# Patient Record
Sex: Male | Born: 1995 | Race: White | Hispanic: No | Marital: Single | Smoking: Never smoker
Health system: Southern US, Community
[De-identification: ages and names within clinical notes are randomized; demographics above are authoritative.]

## PROBLEM LIST (undated history)

## (undated) DIAGNOSIS — Z9109 Other allergy status, other than to drugs and biological substances: Secondary | ICD-10-CM

## (undated) DIAGNOSIS — R569 Unspecified convulsions: Secondary | ICD-10-CM

## (undated) HISTORY — PX: MYRINGOTOMY WITH TUBE PLACEMENT: SHX5663

## (undated) HISTORY — DX: Other allergy status, other than to drugs and biological substances: Z91.09

## (undated) HISTORY — DX: Unspecified convulsions: R56.9

---

## 2014-05-21 ENCOUNTER — Emergency Department (HOSPITAL_COMMUNITY)
Admission: EM | Admit: 2014-05-21 | Discharge: 2014-05-22 | Disposition: A | Discharge status: Home / Self Care | Payer: BC Managed Care – PPO | Attending: Emergency Medicine | Admitting: Emergency Medicine

## 2014-05-21 ENCOUNTER — Encounter (HOSPITAL_COMMUNITY): Payer: Self-pay | Admitting: *Deleted

## 2014-05-21 DIAGNOSIS — Z79899 Other long term (current) drug therapy: Secondary | ICD-10-CM | POA: Diagnosis not present

## 2014-05-21 DIAGNOSIS — R410 Disorientation, unspecified: Secondary | ICD-10-CM | POA: Diagnosis not present

## 2014-05-21 DIAGNOSIS — R110 Nausea: Secondary | ICD-10-CM | POA: Diagnosis not present

## 2014-05-21 DIAGNOSIS — R569 Unspecified convulsions: Secondary | ICD-10-CM

## 2014-05-21 LAB — CBG MONITORING, ED: Glucose-Capillary: 83 mg/dL (ref 70–99)
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
## 2014-05-22 ENCOUNTER — Emergency Department (HOSPITAL_COMMUNITY): Payer: BC Managed Care – PPO

## 2014-05-22 LAB — CBC WITH DIFFERENTIAL/PLATELET
BASOS ABS: 0 10*3/uL (ref 0.0–0.1)
Basophils Relative: 0 % (ref 0–1)
Eosinophils Absolute: 0.2 10*3/uL (ref 0.0–0.7)
Eosinophils Relative: 1 % (ref 0–5)
HEMATOCRIT: 39.8 % (ref 39.0–52.0)
Hemoglobin: 14.1 g/dL (ref 13.0–17.0)
LYMPHS PCT: 25 % (ref 12–46)
Lymphs Abs: 3 10*3/uL (ref 0.7–4.0)
MCH: 29.5 pg (ref 26.0–34.0)
MCHC: 35.4 g/dL (ref 30.0–36.0)
MCV: 83.3 fL (ref 78.0–100.0)
Monocytes Absolute: 0.5 10*3/uL (ref 0.1–1.0)
Monocytes Relative: 4 % (ref 3–12)
NEUTROS ABS: 8.2 10*3/uL — AB (ref 1.7–7.7)
NEUTROS PCT: 70 % (ref 43–77)
Platelets: 300 10*3/uL (ref 150–400)
RBC: 4.78 MIL/uL (ref 4.22–5.81)
RDW: 12.4 % (ref 11.5–15.5)
WBC: 11.9 10*3/uL — AB (ref 4.0–10.5)

## 2014-05-22 LAB — BASIC METABOLIC PANEL
ANION GAP: 13 (ref 5–15)
BUN: 13 mg/dL (ref 6–23)
CALCIUM: 9.1 mg/dL (ref 8.4–10.5)
CO2: 27 meq/L (ref 19–32)
Chloride: 99 mEq/L (ref 96–112)
Creatinine, Ser: 0.88 mg/dL (ref 0.50–1.35)
GFR calc Af Amer: >90 mL/min (ref >90)
GFR calc non Af Amer: >90 mL/min (ref >90)
Glucose, Bld: 89 mg/dL (ref 70–99)
Potassium: 3.8 mEq/L (ref 3.7–5.3)
SODIUM: 139 meq/L (ref 137–147)

## 2014-05-22 LAB — RAPID URINE DRUG SCREEN, HOSP PERFORMED
Amphetamines: NOT DETECTED
BARBITURATES: NOT DETECTED
BENZODIAZEPINES: NOT DETECTED
COCAINE: NOT DETECTED
OPIATES: NOT DETECTED
Tetrahydrocannabinol: NOT DETECTED

## 2014-05-22 LAB — ETHANOL
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
## 2014-05-22 NOTE — ED Notes (Signed)
Awake, sleepy but appropriate. Able to walk in room, denies dizziness, weakness. No nausea. Able to drink fluids. Parents given verbal instructions by dr Bebe Shaggywickline including no driving, swimming alone, bathtub, heights (ladders) etc. Until cleared by neuro. Pt's dad grew up with epileptic brother and is very aware of seizure care. Will ensure f/u with neuro.

## 2014-05-22 NOTE — Discharge Instructions (Signed)
Please be aware you may have another seizure ° °Do not drive until seen by your physician for your condition ° °Do not climb ladders/roofs/trees as a seizure can occur at that height and cause serious harm ° °Do not bathe/swim alone as a seizure can occur and cause serious harm ° °Please followup with your physician or neurologist for further testing and possible treatment ° ° °Seizure, Adult °A seizure is abnormal electrical activity in the brain. Seizures usually last from 30 seconds to 2 minutes. There are various types of seizures. °Before a seizure, you may have a warning sensation (aura) that a seizure is about to occur. An aura may include the following symptoms:  °· Fear or anxiety. °· Nausea. °· Feeling like the room is spinning (vertigo). °· Vision changes, such as seeing flashing lights or spots. °Common symptoms during a seizure include: °· A change in attention or behavior (altered mental status). °· Convulsions with rhythmic jerking movements. °· Drooling. °· Rapid eye movements. °· Grunting. °· Loss of bladder and bowel control. °· Bitter taste in the mouth. °· Tongue biting. °After a seizure, you may feel confused and sleepy. You may also have an injury resulting from convulsions during the seizure. °HOME CARE INSTRUCTIONS  °· If you are given medicines, take them exactly as prescribed by your health care provider. °· Keep all follow-up appointments as directed by your health care provider. °· Do not swim or drive or engage in risky activity during which a seizure could cause further injury to you or others until your health care provider says it is OK. °· Get adequate rest. °· Teach friends and family what to do if you have a seizure. They should: °¨ Lay you on the ground to prevent a fall. °¨ Put a cushion under your head. °¨ Loosen any tight clothing around your neck. °¨ Turn you on your side. If vomiting occurs, this helps keep your airway clear. °¨ Stay with you until you recover. °¨ Know  whether or not you need emergency care. °SEEK IMMEDIATE MEDICAL CARE IF: °· The seizure lasts longer than 5 minutes. °· The seizure is severe or you do not wake up immediately after the seizure. °· You have an altered mental status after the seizure. °· You are having more frequent or worsening seizures. °Someone should drive you to the emergency department or call local emergency services (911 in [place] °MAKE SURE YOU: °· Understand these instructions. °· Will watch your condition. °· Will get help right away if you are not doing well or get worse. °Document Released: 06/13/2000 Document Revised: 04/06/2013 Document Reviewed: 01/26/2013 °ExitCare® Patient Information ©2015 [employer]. This information is not intended to replace advice given to you by your health care provider. Make sure you discuss any questions you have with your health care provider. ° °

## 2014-05-22 NOTE — ED Notes (Signed)
Sleeping, no seizure activity noted

## 2014-05-31 ENCOUNTER — Encounter: Payer: Self-pay | Admitting: Neurology

## 2014-05-31 ENCOUNTER — Ambulatory Visit (INDEPENDENT_AMBULATORY_CARE_PROVIDER_SITE_OTHER): Payer: BC Managed Care – PPO | Admitting: Neurology

## 2014-05-31 VITALS — BP 117/80 | HR 60 | Ht 71.0 in | Wt 200.0 lb

## 2014-05-31 DIAGNOSIS — R569 Unspecified convulsions: Secondary | ICD-10-CM

## 2014-05-31 DIAGNOSIS — Z9109 Other allergy status, other than to drugs and biological substances: Secondary | ICD-10-CM | POA: Insufficient documentation

## 2014-05-31 DIAGNOSIS — G40309 Generalized idiopathic epilepsy and epileptic syndromes, not intractable, without status epilepticus: Secondary | ICD-10-CM | POA: Insufficient documentation

## 2014-05-31 NOTE — Patient Instructions (Signed)
No driving to seizure free for 6 months, last seizure was November 22nd 2015
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
## 2014-06-14 ENCOUNTER — Ambulatory Visit (INDEPENDENT_AMBULATORY_CARE_PROVIDER_SITE_OTHER): Payer: BC Managed Care – PPO

## 2014-06-14 DIAGNOSIS — Z9109 Other allergy status, other than to drugs and biological substances: Secondary | ICD-10-CM

## 2014-06-14 DIAGNOSIS — R569 Unspecified convulsions: Secondary | ICD-10-CM

## 2014-06-15 ENCOUNTER — Ambulatory Visit (INDEPENDENT_AMBULATORY_CARE_PROVIDER_SITE_OTHER): Payer: BC Managed Care – PPO | Admitting: Neurology

## 2014-06-15 DIAGNOSIS — R569 Unspecified convulsions: Secondary | ICD-10-CM

## 2014-06-15 DIAGNOSIS — Z9109 Other allergy status, other than to drugs and biological substances: Secondary | ICD-10-CM

## 2014-06-15 MED ORDER — GADOPENTETATE DIMEGLUMINE 469.01 MG/ML IV SOLN
19.0000 mL | Freq: Once | INTRAVENOUS | Status: AC | PRN
Start: 2014-06-15 — End: 2014-06-15

## 2014-06-16 NOTE — Procedures (Signed)
   HISTORY: 18 years old male, presenting one generalized seizure November 22nd 2015  TECHNIQUE:  16 channel EEG was performed based on standard 10-16 international system. One channel was dedicated to EKG, which has demonstrates normal sinus rhythm of 90 beats per minutes.  Upon awakening, the posterior background activity was well-developed, in alpha range 11 Hz, reactive to eye opening and closure.  There was no evidence of epileptiform discharge.  Photic stimulation was performed, which induced a symmetric photic driving.  Hyperventilation was performed, there was no abnormality elicit.  Stage II sleep was achieved.  CONCLUSION: This is a  normal awake and asleep EEG.  There is no electrodiagnostic evidence of epileptiform discharge

## 2014-06-19 NOTE — Progress Notes (Signed)
Quick Note:  Called patient and left message normal MRI results. ______

## 2014-09-05 ENCOUNTER — Ambulatory Visit: Payer: BC Managed Care – PPO | Admitting: Adult Health

## 2014-09-26 ENCOUNTER — Ambulatory Visit (INDEPENDENT_AMBULATORY_CARE_PROVIDER_SITE_OTHER): Payer: BLUE CROSS/BLUE SHIELD | Admitting: Adult Health

## 2014-09-26 ENCOUNTER — Encounter: Payer: Self-pay | Admitting: Adult Health

## 2014-09-26 VITALS — BP 122/72 | HR 82 | Ht 71.0 in | Wt 205.0 lb

## 2014-09-26 DIAGNOSIS — R569 Unspecified convulsions: Secondary | ICD-10-CM | POA: Diagnosis not present

## 2014-09-26 NOTE — Patient Instructions (Signed)
If you have any seizure events please let us know.  No driving for 6 months- may resume May 23rd if no seizures.
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
## 2014-10-04 NOTE — Progress Notes (Signed)
I have reviewed and agreed above plan. 

## 2015-03-29 ENCOUNTER — Encounter: Payer: Self-pay | Admitting: Adult Health

## 2015-03-29 ENCOUNTER — Ambulatory Visit (INDEPENDENT_AMBULATORY_CARE_PROVIDER_SITE_OTHER): Payer: BLUE CROSS/BLUE SHIELD | Admitting: Adult Health

## 2015-03-29 VITALS — BP 120/68 | HR 72 | Ht 71.0 in | Wt 202.0 lb

## 2015-03-29 DIAGNOSIS — R569 Unspecified convulsions: Secondary | ICD-10-CM

## 2015-03-29 NOTE — Patient Instructions (Signed)
Overall doing well If you have any additional seizure events please let us know.
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
## 2015-03-29 NOTE — Progress Notes (Signed)
I have reviewed and agreed above plan. 
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
## 2015-09-27 ENCOUNTER — Telehealth: Payer: Self-pay | Admitting: Neurology

## 2015-09-27 NOTE — Telephone Encounter (Signed)
error 

## 2015-09-27 NOTE — Telephone Encounter (Addendum)
Pt's mother Kelly said pt had a seizureTresa Endo 09/26/15 about 4:15pm. His sister was with him, she said he was sitting in the chair at the computer and he threw his hands up in the air, he slid out of the chair, hit the coffee table above the rt eye and landed on the floor. Pt was mumbling and trying to get up. He bit the edge of his tongue.  After a few minutes he was able to get up with the assistance of his sister and laid on the couch. His uncle who is an EMT was called, he checked all vitals. Pt  did not know what day it was, he did know who the president was but he doesn't remember any of the events that happened. Pt has seen Aundra MilletMegan and Dr Terrace ArabiaYan but there is not an opening for pt to be seen today or tomorrow. Mother is not on DPR. Son is at home. Mother is wanting him to be seen today if possible.

## 2015-10-01 NOTE — Telephone Encounter (Signed)
Called patient - he has been worked into the schedule this week.

## 2015-10-04 ENCOUNTER — Encounter: Payer: Self-pay | Admitting: Neurology

## 2015-10-04 ENCOUNTER — Ambulatory Visit (INDEPENDENT_AMBULATORY_CARE_PROVIDER_SITE_OTHER): Payer: BLUE CROSS/BLUE SHIELD | Admitting: Neurology

## 2015-10-04 VITALS — BP 118/62 | HR 72 | Ht 71.0 in | Wt 201.5 lb

## 2015-10-04 DIAGNOSIS — R569 Unspecified convulsions: Secondary | ICD-10-CM

## 2015-10-04 MED ORDER — DIVALPROEX SODIUM ER 500 MG PO TB24: 500.0000 mg | ORAL_TABLET | Freq: Every day | ORAL | Status: DC
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
## 2015-10-10 ENCOUNTER — Telehealth: Payer: Self-pay | Admitting: Neurology

## 2015-10-10 NOTE — Telephone Encounter (Signed)
Per Angie in billing EEG cannot be scheduled until payments are made on bad debt.  Pt is aware of this and will call to make some sort of pmt arrangement to set up test.

## 2015-12-24 ENCOUNTER — Telehealth: Payer: Self-pay | Admitting: Neurology

## 2015-12-24 NOTE — Telephone Encounter (Signed)
Pt's mother called to schedule EEG. [facility]

## 2016-01-14 ENCOUNTER — Ambulatory Visit (INDEPENDENT_AMBULATORY_CARE_PROVIDER_SITE_OTHER): Payer: BLUE CROSS/BLUE SHIELD | Admitting: Neurology

## 2016-01-14 DIAGNOSIS — R569 Unspecified convulsions: Secondary | ICD-10-CM

## 2016-01-15 ENCOUNTER — Telehealth: Payer: Self-pay | Admitting: Neurology

## 2016-01-15 MED ORDER — DIVALPROEX SODIUM ER 500 MG PO TB24: 1500.0000 mg | ORAL_TABLET | Freq: Every day | ORAL | Status: DC

## 2016-01-15 NOTE — Telephone Encounter (Signed)
I have called patient about marked abnormal EEG, he is at increased chance of having recurrent seizure, he has been taking Depakote ER 500 mg every night,  I have advised him to increase to Depakote ER 500 mg 3 tablets at night, I have called a new prescription for him, also advised him no driving until further notification  Nathan Rice, please give him a follow-up appointment in August or September.

## 2016-01-15 NOTE — Procedures (Signed)
   HISTORY: 20 years old male, had a history of seizure, most recent seizure was in September 26 2015. He is taking Depakote ER 500 mg every night   TECHNIQUE:  16 channel EEG was performed based on standard 10-16 international system. One channel was dedicated to EKG, which has demonstrates normal sinus rhythm of  60 beats per minutes.  Majority of the tracing was covered with burst of generalized spike slow waves, the longest one lasting more than 20 seconds, there was no clinical seizure documented.   In between, the background activity was well-developed, alpha range, reactive to eye opening and closure.   Photic stimulation and hyperventilation was performed, there was increased burst of generalized epileptiform discharge, high amplitude, longer lasting, more than 20 seconds.   No sleep was achieved.  CONCLUSION: This is  are marked abnormal EEG, there is electrodiagnostic evidence of generalized epileptiform discharges.   Levert FeinsteinYijun Micaela Stith, M.D. Ph.D.  [facility] [address] [place], [place] [address] Phone: [phone] Fax:      [phone]

## 2016-01-15 NOTE — Telephone Encounter (Addendum)
Returned call and scheduled follow up appointment.

## 2016-03-05 ENCOUNTER — Encounter: Payer: Self-pay | Admitting: Neurology

## 2016-03-05 ENCOUNTER — Ambulatory Visit (INDEPENDENT_AMBULATORY_CARE_PROVIDER_SITE_OTHER): Payer: BLUE CROSS/BLUE SHIELD | Admitting: Neurology

## 2016-03-05 VITALS — BP 112/69 | HR 60 | Ht 71.0 in | Wt 217.8 lb

## 2016-03-05 DIAGNOSIS — G40309 Generalized idiopathic epilepsy and epileptic syndromes, not intractable, without status epilepticus: Secondary | ICD-10-CM
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
## 2016-03-07 LAB — COMPREHENSIVE METABOLIC PANEL
ALK PHOS: 96 IU/L (ref 39–117)
ALT: 54 IU/L — AB (ref 0–44)
AST: 34 IU/L (ref 0–40)
Albumin/Globulin Ratio: 1.8 (ref 1.2–2.2)
Albumin: 4.4 g/dL (ref 3.5–5.5)
BUN/Creatinine Ratio: 14 (ref 9–20)
BUN: 10 mg/dL (ref 6–20)
Bilirubin Total: 0.2 mg/dL (ref 0.0–1.2)
CALCIUM: 9 mg/dL (ref 8.7–10.2)
CO2: 26 mmol/L (ref 18–29)
Chloride: 102 mmol/L (ref 96–106)
Creatinine, Ser: 0.73 mg/dL — ABNORMAL LOW (ref 0.76–1.27)
GFR calc Af Amer: 152 mL/min/{1.73_m2} (ref >59)
GFR, EST NON AFRICAN AMERICAN: 132 mL/min/{1.73_m2} (ref >59)
Globulin, Total: 2.4 g/dL (ref 1.5–4.5)
Glucose: 76 mg/dL (ref 65–99)
POTASSIUM: 4.6 mmol/L (ref 3.5–5.2)
Sodium: 143 mmol/L (ref 134–144)
Total Protein: 6.8 g/dL (ref 6.0–8.5)

## 2016-03-07 LAB — CBC
Hematocrit: 41.1 % (ref 37.5–51.0)
Hemoglobin: 13.9 g/dL (ref 12.6–17.7)
MCH: 29.5 pg (ref 26.6–33.0)
MCHC: 33.8 g/dL (ref 31.5–35.7)
MCV: 87 fL (ref 79–97)
Platelets: 271 10*3/uL (ref 150–379)
RBC: 4.71 x10E6/uL (ref 4.14–5.80)
RDW: 14.4 % (ref 12.3–15.4)
WBC: 6.2 10*3/uL (ref 3.4–10.8)

## 2016-03-07 LAB — VALPROIC ACID LEVEL, FREE: VALPROIC ACID FREE: 18.6 ug/mL (ref 6.0–22.0)

## 2016-04-08 ENCOUNTER — Ambulatory Visit (INDEPENDENT_AMBULATORY_CARE_PROVIDER_SITE_OTHER): Payer: BLUE CROSS/BLUE SHIELD | Admitting: Neurology

## 2016-04-08 DIAGNOSIS — G40309 Generalized idiopathic epilepsy and epileptic syndromes, not intractable, without status epilepticus: Secondary | ICD-10-CM

## 2016-04-14 NOTE — Procedures (Signed)
   HISTORY: 20 years old male, with history of epilepsy disorder, recurrent seizures, previous abnormal EEG in July 2017.  TECHNIQUE:  2916 channel EEG was performed based on standard 10-16 international system. One channel was dedicated to EKG, which has demonstrates normal sinus rhythm of 60 beats per minutes.  This is a sleep deprived EEG, patient was in sleep at the beginning of the tracing, there was intermittent short lasting generalized spike slow waves, consistent with generalized epileptiform discharge.  Patient was awake transiently, the posterior background activity was well-developed, in alpha range, with amplitude of microvoltage, reactive to eye opening and closure.  He then drifted into stage II sleep again, there was evidence of sleep spindles, also continued evidence of generalized spike slow waves, the longest one lasting 7-8 seconds.  Photic stimulation and hyperventilation was performed, there was continued evidence of intermittent generalized spike slow waves. There was no clinical seizure activity documented.  CONCLUSION: This is an abnormal normal EEG.  There is electrodiagnostic evidence of generalized epileptiform discharge.  Nathan FeinsteinYijun Ivon Rice, M.D. Ph.D.  [facility] [address] [place], [place] [address] Phone: [phone] Fax:      [phone]

## 2016-05-02 ENCOUNTER — Telehealth: Payer: Self-pay | Admitting: Neurology

## 2016-05-02 NOTE — Telephone Encounter (Signed)
I talked with patient, EEG in October 2017 continue show evidence of generalized epileptiform discharge, emphasized importance of complying with his antiepileptic medications Depakote ER 500 mg 3 tablets every night.  He is to call office for any recurrent seizure-like event, he reported last clinical seizure was in March 2017,

## 2016-05-02 NOTE — Telephone Encounter (Signed)
Pt called in for EEG results. Please call and advise

## 2016-09-03 ENCOUNTER — Encounter (INDEPENDENT_AMBULATORY_CARE_PROVIDER_SITE_OTHER): Payer: Self-pay

## 2016-09-03 ENCOUNTER — Ambulatory Visit (INDEPENDENT_AMBULATORY_CARE_PROVIDER_SITE_OTHER): Payer: BLUE CROSS/BLUE SHIELD | Admitting: Adult Health

## 2016-09-03 ENCOUNTER — Encounter: Payer: Self-pay | Admitting: Adult Health

## 2016-09-03 VITALS — BP 124/77 | HR 55 | Ht 71.0 in | Wt 227.2 lb

## 2016-09-03 DIAGNOSIS — R569 Unspecified convulsions: Secondary | ICD-10-CM

## 2016-09-03 DIAGNOSIS — Z5181 Encounter for therapeutic drug level monitoring: Secondary | ICD-10-CM | POA: Diagnosis not present

## 2016-09-03 NOTE — Progress Notes (Signed)
I have reviewed and agreed above plan. 
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
## 2016-09-03 NOTE — Patient Instructions (Signed)
Continue Depakote Blood work today If you have a seizure please let us know If your symptoms worsen or you develop new symptoms please let us know.   Seizure, Adult When you have a seizure:  Parts of your body may move.  How aware or awake (conscious) you are may change.  You may shake (convulse). Some people have symptoms right before a seizure happens. These symptoms may include:  Fear.  Worry (anxiety).  Feeling like you are going to throw up (nausea).  Feeling like the room is spinning (vertigo).  Feeling like you saw or heard something before (deja vu).  Odd tastes or smells.  Changes in vision, such as seeing flashing lights or spots. Seizures usually last from 30 seconds to 2 minutes. Usually, they are not harmful unless they last a long time. Follow these instructions at home: Medicines   Take over-the-counter and prescription medicines only as told by your doctor.  Avoid anything that may keep your medicine from working, such as alcohol. Activity   Do not do any activities that would be dangerous if you had another seizure, like driving or swimming. Wait until your doctor approves.  If you live in the [place] ask your local DMV (department of motor vehicles) when you can drive.  Rest. Teaching others   Teach friends and family what to do when you have a seizure. They should:  Lay you on the ground.  Protect your head and body.  Loosen any tight clothing around your neck.  Turn you on your side.  Stay with you until you are better.  Not hold you down.  Not put anything in your mouth.  Know whether or not you need emergency care. General instructions   Contact your doctor each time you have a seizure.  Avoid anything that gives you seizures.  Keep a seizure diary. Write down:  What you think caused each seizure.  What you remember about each seizure.  Keep all follow-up visits as told by your doctor. This is important. Contact a doctor  if:  You have another seizure.  You have seizures more often.  There is any change in what happens during your seizures.  You continue to have seizures with treatment.  You have symptoms of being sick or having an infection. Get help right away if:  You have a seizure:  That lasts longer than 5 minutes.  That is different than seizures you had before.  That makes it harder to breathe.  After you hurt your head.  After a seizure, you cannot speak or use a part of your body.  After a seizure, you are confused or have a bad headache.  You have two or more seizures in a row.  You are having seizures more often.  You do not wake up right after a seizure.  You get hurt during a seizure. In an emergency:  These symptoms may be an emergency. Do not wait to see if the symptoms will go away. Get medical help right away. Call your local emergency services (911 in the [place] Do not drive yourself to the hospital. This information is not intended to replace advice given to you by your health care provider. Make sure you discuss any questions you have with your health care provider. Document Released: 12/03/2007 Document Revised: 02/27/2016 Document Reviewed: 02/27/2016 Elsevier Interactive Patient Education  2017 [employer]

## 2016-09-04 LAB — CBC WITH DIFFERENTIAL/PLATELET
BASOS ABS: 0 10*3/uL (ref 0.0–0.2)
Basos: 0 %
EOS (ABSOLUTE): 0.1 10*3/uL (ref 0.0–0.4)
Eos: 2 %
Hematocrit: 41.9 % (ref 37.5–51.0)
Hemoglobin: 14.1 g/dL (ref 13.0–17.7)
IMMATURE GRANS (ABS): 0 10*3/uL (ref 0.0–0.1)
Immature Granulocytes: 0 %
Lymphocytes Absolute: 2.6 10*3/uL (ref 0.7–3.1)
Lymphs: 46 %
MCH: 30.1 pg (ref 26.6–33.0)
MCHC: 33.7 g/dL (ref 31.5–35.7)
MCV: 89 fL (ref 79–97)
MONOS ABS: 0.4 10*3/uL (ref 0.1–0.9)
Monocytes: 7 %
NEUTROS ABS: 2.7 10*3/uL (ref 1.4–7.0)
NEUTROS PCT: 45 %
PLATELETS: 292 10*3/uL (ref 150–379)
RBC: 4.69 x10E6/uL (ref 4.14–5.80)
RDW: 13.6 % (ref 12.3–15.4)
WBC: 5.8 10*3/uL (ref 3.4–10.8)

## 2016-09-04 LAB — COMPREHENSIVE METABOLIC PANEL
A/G RATIO: 1.7 (ref 1.2–2.2)
ALT: 23 IU/L (ref 0–44)
AST: 14 IU/L (ref 0–40)
Albumin: 4.5 g/dL (ref 3.5–5.5)
Alkaline Phosphatase: 87 IU/L (ref 39–117)
BILIRUBIN TOTAL: 0.5 mg/dL (ref 0.0–1.2)
BUN/Creatinine Ratio: 13 (ref 9–20)
BUN: 12 mg/dL (ref 6–20)
CHLORIDE: 98 mmol/L (ref 96–106)
CO2: 27 mmol/L (ref 18–29)
Calcium: 9.5 mg/dL (ref 8.7–10.2)
Creatinine, Ser: 0.92 mg/dL (ref 0.76–1.27)
GFR calc non Af Amer: 118 mL/min/{1.73_m2} (ref >59)
GFR, EST AFRICAN AMERICAN: 136 mL/min/{1.73_m2} (ref >59)
Globulin, Total: 2.7 g/dL (ref 1.5–4.5)
Glucose: 85 mg/dL (ref 65–99)
POTASSIUM: 4.2 mmol/L (ref 3.5–5.2)
Sodium: 140 mmol/L (ref 134–144)
Total Protein: 7.2 g/dL (ref 6.0–8.5)

## 2016-09-04 LAB — PHENYTOIN LEVEL, TOTAL

## 2016-09-10 ENCOUNTER — Telehealth: Payer: Self-pay | Admitting: Adult Health

## 2016-09-10 LAB — SPECIMEN STATUS REPORT

## 2016-09-10 NOTE — Telephone Encounter (Signed)
I called the patient and left a message for him to call our office. His lab work is unremarkable with the exception that a Dilantin level was ordered/checked instead of Depakote. The patient is not on Dilantin. I have spoken to Havilandheryl with the lab and she is asking that they recheck his blood specimen  for a Depakote level. At this time the patient does not need to come in for repeat blood work. I will advise patient of this when he calls back.

## 2016-09-11 ENCOUNTER — Telehealth: Payer: Self-pay | Admitting: *Deleted

## 2016-09-11 LAB — SPECIMEN STATUS REPORT

## 2016-09-11 LAB — VALPROIC ACID LEVEL: Valproic Acid Lvl: 112 ug/mL — ABNORMAL HIGH (ref 50–100)

## 2016-09-11 NOTE — Telephone Encounter (Signed)
LVM requesting call back, re: lab results. Left number.

## 2016-09-12 NOTE — Telephone Encounter (Signed)
LMVM for pt on his only # to return call for lab results.  Here today till 1200.

## 2016-09-12 NOTE — Telephone Encounter (Signed)
-----   Message from Butch PennyMegan Millikan, NP sent at 09/11/2016  8:25 AM EDT ----- Depakote level is slightly elevated. We will need to recheck in 1 month. No signs of toxicity on exam. Please call patient with results.

## 2016-09-15 ENCOUNTER — Telehealth: Payer: Self-pay | Admitting: *Deleted

## 2016-09-15 ENCOUNTER — Encounter: Payer: Self-pay | Admitting: *Deleted

## 2016-09-15 NOTE — Telephone Encounter (Signed)
-----   Message from Megan Millikan, NP sent at 09/11/2016  8:25 AM EDT ----- Depakote level is slightly elevated. We will need to recheck in 1 month. No signs of toxicity on exam. Please call patient with results. 

## 2016-09-15 NOTE — Telephone Encounter (Signed)
Mailed letter to pt with results and need ot recheck in one month due to slightly elevated depakote level.

## 2016-09-15 NOTE — Telephone Encounter (Signed)
Have not heard from pt.  Will Mail letter with results and result note.

## 2016-09-29 NOTE — Telephone Encounter (Signed)
Patients mom called office in reference to scheduling fu visit to recheck depakote levels.  I was not sure if patient needed to be seen this month or May.  Please call

## 2016-09-29 NOTE — Telephone Encounter (Signed)
Spoke with patient and advised him that he does not need to see NP before his follow up in 6 months. Advised he needs to come in and have lab redrawn. Gave him office lab hours. Patient repeated correctly the instructions and stated he had written them down.

## 2016-10-16 ENCOUNTER — Other Ambulatory Visit (INDEPENDENT_AMBULATORY_CARE_PROVIDER_SITE_OTHER): Payer: Self-pay

## 2016-10-16 ENCOUNTER — Other Ambulatory Visit: Payer: Self-pay | Admitting: Adult Health

## 2016-10-16 ENCOUNTER — Encounter (INDEPENDENT_AMBULATORY_CARE_PROVIDER_SITE_OTHER): Payer: Self-pay

## 2016-10-16 DIAGNOSIS — Z5181 Encounter for therapeutic drug level monitoring: Secondary | ICD-10-CM

## 2016-10-16 DIAGNOSIS — Z0289 Encounter for other administrative examinations: Secondary | ICD-10-CM

## 2016-10-17 LAB — VALPROIC ACID LEVEL: Valproic Acid Lvl: 98 ug/mL (ref 50–100)

## 2016-10-20 ENCOUNTER — Telehealth: Payer: Self-pay

## 2016-10-20 NOTE — Telephone Encounter (Signed)
I spoke to patient and he is aware of results and recommendations.  

## 2016-10-20 NOTE — Telephone Encounter (Signed)
-----   Message from Butch Penny, NP sent at 10/20/2016  1:22 PM EDT ----- Laboratory normal. Please call patient with result.

## 2017-01-16 ENCOUNTER — Other Ambulatory Visit: Payer: Self-pay | Admitting: Neurology

## 2017-03-09 ENCOUNTER — Encounter: Payer: Self-pay | Admitting: Adult Health

## 2017-03-09 ENCOUNTER — Ambulatory Visit (INDEPENDENT_AMBULATORY_CARE_PROVIDER_SITE_OTHER): Payer: BLUE CROSS/BLUE SHIELD | Admitting: Adult Health

## 2017-03-09 VITALS — BP 125/77 | HR 71 | Wt 239.2 lb

## 2017-03-09 DIAGNOSIS — R569 Unspecified convulsions: Secondary | ICD-10-CM | POA: Diagnosis not present

## 2017-03-09 NOTE — Progress Notes (Signed)
I have reviewed and agreed above plan. 

## 2017-03-09 NOTE — Patient Instructions (Signed)
Your Plan:  Continue Depakote We will recheck blood work at the next visit If your symptoms worsen or you develop new symptoms please let us know.    Thank you for [place] to see [facility] at [facility]. I hope we have been able to provide you high quality care today.  You may receive a patient satisfaction survey over the next few weeks. We would appreciate your feedback and comments so that we may continue to improve ourselves and the health of our patients.
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
## 2017-07-27 ENCOUNTER — Encounter: Payer: Self-pay | Admitting: Neurology

## 2017-09-07 ENCOUNTER — Ambulatory Visit: Payer: BLUE CROSS/BLUE SHIELD | Admitting: Neurology

## 2017-10-05 ENCOUNTER — Encounter: Payer: Self-pay | Admitting: Neurology

## 2017-10-05 ENCOUNTER — Ambulatory Visit: Payer: BLUE CROSS/BLUE SHIELD | Admitting: Neurology

## 2017-10-05 VITALS — BP 137/81 | HR 66 | Ht 71.0 in | Wt 253.5 lb

## 2017-10-05 DIAGNOSIS — G40309 Generalized idiopathic epilepsy and epileptic syndromes, not intractable, without status epilepticus: Secondary | ICD-10-CM | POA: Diagnosis not present

## 2017-10-05 MED ORDER — DIVALPROEX SODIUM ER 500 MG PO TB24: 1500.0000 mg | ORAL_TABLET | Freq: Every day | ORAL | 4 refills | Status: DC
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
## 2017-11-20 ENCOUNTER — Encounter: Payer: Self-pay | Admitting: Family Medicine

## 2017-11-20 DIAGNOSIS — K029 Dental caries, unspecified: Secondary | ICD-10-CM | POA: Diagnosis not present

## 2017-11-20 DIAGNOSIS — Z23 Encounter for immunization: Secondary | ICD-10-CM | POA: Diagnosis not present

## 2017-11-20 DIAGNOSIS — E6609 Other obesity due to excess calories: Secondary | ICD-10-CM | POA: Diagnosis not present

## 2017-11-20 DIAGNOSIS — Z0001 Encounter for general adult medical examination with abnormal findings: Secondary | ICD-10-CM | POA: Diagnosis not present

## 2017-11-20 DIAGNOSIS — G40909 Epilepsy, unspecified, not intractable, without status epilepticus: Secondary | ICD-10-CM | POA: Diagnosis not present

## 2017-11-20 DIAGNOSIS — Z6835 Body mass index (BMI) 35.0-35.9, adult: Secondary | ICD-10-CM | POA: Diagnosis not present

## 2017-11-20 DIAGNOSIS — Z1389 Encounter for screening for other disorder: Secondary | ICD-10-CM | POA: Diagnosis not present

## 2018-02-10 ENCOUNTER — Encounter: Payer: BLUE CROSS/BLUE SHIELD | Attending: Family Medicine | Admitting: Nutrition

## 2018-02-10 ENCOUNTER — Encounter: Payer: Self-pay | Admitting: Nutrition

## 2018-02-10 DIAGNOSIS — Z713 Dietary counseling and surveillance: Secondary | ICD-10-CM | POA: Diagnosis not present

## 2018-02-10 DIAGNOSIS — E669 Obesity, unspecified: Secondary | ICD-10-CM | POA: Diagnosis not present

## 2018-02-10 DIAGNOSIS — Z6834 Body mass index (BMI) 34.0-34.9, adult: Secondary | ICD-10-CM | POA: Insufficient documentation

## 2018-02-10 NOTE — Progress Notes (Signed)
  Medical Nutrition Therapy:  Appt start time: 1600 end time:  1700.  Assessment:  Primary concerns today: Obesity. Wants to lose weight. Live with his parents. Not working. Eat 2-3 meal per day. Watches TV mostly and helps run errands for his parents.  Cut out soda a few years ago. Drinks water and sweet tea. Physical activity:  Walking outside. Engaged to make changes for needed weight loss. Current diet exceeds needs contributing weight loss. Wt Readings from Last 3 Encounters:  02/10/18 253 lb (114.8 kg)  10/05/17 253 lb 8 oz (115 kg)  03/09/17 239 lb 3.2 oz (108.5 kg)   Ht Readings from Last 3 Encounters:  02/10/18 6' (1.829 m)  10/05/17 5\' 11"  (1.803 m)  09/03/16 5\' 11"  (1.803 m)   Body mass index is 34.31 kg/m.  Preferred Learning Style:   No preference indicated   Learning Readiness:  Ready  Change in progress   MEDICATIONS:    DIETARY INTAKE:  24-hr recall:  B ( AM):  Biscuits and gravy,   Sweet tea  Snk ( AM): chips  L ( PM):  3 hot dogs withouts, french fries, sweet tea Snk ( PM): water D ( PM):Ramen noodles,  Sweet tea. Snk ( PM):  Beverages: water n sweet tea,  Usual physical activity: walks  Estimated energy needs: 1800  calories 200  g carbohydrates 135 g protein 50 g fat  Progress Towards Goal(s):  In progress.   Nutritional Diagnosis:  NB-1.1 Food and nutrition-related knowledge deficit As related to Overweight.  As evidenced by BMI >.    Intervention: Nutrition and Diabetes education provided on My Plate, CHO counting, meal planning, portion sizes, timing of meals, avoiding snacks between meals benefits of exercising 60 minutes per day and prevention of DM.   Goals 1. Follow My Plate 2. Increase fresh fruits and vegetables. 3. Drink only water and less sweet tea 4. Don't skip meals 5. Walk 60 minutes a day Lose 1 lb per week   Teaching Method Utilized:  Visual Auditory Hands on  Handouts given during visit include:  The Plate  Method   Meal Plan Card   Barriers to learning/adherence to lifestyle change: none   Demonstrated degree of understanding via:  Teach Back   Monitoring/Evaluation:  Dietary intake, exercise,  Meal planning, and body weight in 1 month(s).

## 2018-02-10 NOTE — Patient Instructions (Addendum)
Goals 1. Follow My Plate 2. Increase fresh fruits and vegetables. 3. Drink only water and less sweet tea 4. Don't skip meals 5. Walk 60 minutes a day Lose 1 lb per week

## 2018-03-18 ENCOUNTER — Encounter: Payer: BLUE CROSS/BLUE SHIELD | Attending: Family Medicine | Admitting: Nutrition

## 2018-03-18 VITALS — Ht 71.0 in | Wt 249.0 lb

## 2018-03-18 DIAGNOSIS — E669 Obesity, unspecified: Secondary | ICD-10-CM

## 2018-03-18 NOTE — Progress Notes (Signed)
  Medical Nutrition Therapy:  Appt start time: 1030 end time:  1100.  Assessment:  Primary concerns today: Obesity. Wants to lose weight. Lost 4 lbs since last visit. Changed made: eating meals on time. Cut out snacking. Drinking a little more water and cut down on sugar in tea, Walking more often with his brother.  Desires to get down to 210-220 lbs.   Wt Readings from Last 3 Encounters:  03/18/18 249 lb (112.9 kg)  02/10/18 253 lb (114.8 kg)  10/05/17 253 lb 8 oz (115 kg)   Ht Readings from Last 3 Encounters:  03/18/18 5\' 11"  (1.803 m)  02/10/18 6' (1.829 m)  10/05/17 5\' 11"  (1.803 m)   Body mass index is 34.73 kg/m.  Preferred Learning Style:   No preference indicated   Learning Readiness:  Ready  Change in progress   MEDICATIONS:    DIETARY INTAKE:  24-hr recall:  B ( AM): skips occasionally but eating less often gravy biscuits. Ate 2 boiled eggs this am. Snk ( AM): L) mashed potatoes, corn and green beans,  Diluted tea Snk ( PM): water D ( PM):ramen noodles, diluted tea Snk ( PM):  Beverages: water n sweet tea,  Usual physical activity: walks  Estimated energy needs: 1800  calories 200  g carbohydrates 135 g protein 50 g fat  Progress Towards Goal(s):  In progress.   Nutritional Diagnosis:  NB-1.1 Food and nutrition-related knowledge deficit As related to Overweight.  As evidenced by BMI >.    Intervention: Nutrition and Diabetes education provided on My Plate, CHO counting, meal planning, portion sizes, timing of meals, avoiding snacks between meals benefits of exercising 60 minutes per day and prevention of DM.  Cutting out sugar and fat.  Goals 1. Increase lower carb vegetables of salads etc Keep diluting the tea and slowly increase water Increase exercise to walking 3-4 times per week.'  Add 2 piece of fruit a day. Lose 3-4 lbs per month  Teaching Method Utilized:  Visual Auditory Hands on  Handouts given during visit include:  The  Plate Method   Meal Plan Card   Barriers to learning/adherence to lifestyle change: none   Demonstrated degree of understanding via:  Teach Back   Monitoring/Evaluation:  Dietary intake, exercise,  Meal planning, and body weight in  3 month(s).

## 2018-03-18 NOTE — Patient Instructions (Addendum)
Goals 1. Increase lower carb vegetables of salads etc Keep diluting the tea and slowly increase water Increase exercise to walking 3-4 times per week.'  Add 2 piece of fruit a day. Lose 3-4 lbs per month

## 2018-04-01 ENCOUNTER — Encounter: Payer: Self-pay | Admitting: Nutrition

## 2018-05-19 ENCOUNTER — Encounter: Payer: BLUE CROSS/BLUE SHIELD | Attending: Family Medicine | Admitting: Nutrition

## 2018-05-19 VITALS — Ht 70.0 in | Wt 247.0 lb

## 2018-05-19 DIAGNOSIS — E669 Obesity, unspecified: Secondary | ICD-10-CM | POA: Diagnosis not present

## 2018-05-19 DIAGNOSIS — Z713 Dietary counseling and surveillance: Secondary | ICD-10-CM | POA: Insufficient documentation

## 2018-05-19 DIAGNOSIS — Z6835 Body mass index (BMI) 35.0-35.9, adult: Secondary | ICD-10-CM | POA: Insufficient documentation

## 2018-05-19 NOTE — Progress Notes (Signed)
  Medical Nutrition Therapy:  Appt start time: 1030 end time:  1100.  Assessment:  Primary concerns today: Obesity. Wants to lose weight. Lost 2 lbs since last visit. Admits to stress eating. He had been down to 243 lbs but gained few pound back after stress eating. Trying to get up eat earlier in am for breakfast.  Changed made: eating meals on time. Cut out snacking. Drinking a little more water and cut down on sugar in tea,  Desires to get down to 210-220 lbs. Trying to reduce amount of sugar in tea. Looking to do video exercises. No new labs for review.   Wt Readings from Last 3 Encounters:  05/19/18 247 lb (112 kg)  03/18/18 249 lb (112.9 kg)  02/10/18 253 lb (114.8 kg)   Ht Readings from Last 3 Encounters:  05/19/18 5\' 10"  (1.778 m)  03/18/18 5\' 11"  (1.803 m)  02/10/18 6' (1.829 m)   Body mass index is 35.44 kg/m.  Preferred Learning Style:   No preference indicated   Learning Readiness:  Ready  Change in progress   MEDICATIONS:    DIETARY INTAKE:  24-hr recall:  B ( AM): Gravy less amount, hashbrowns and biscuits,  Snk ( AM): L) Potato wedges home cooked 15-20, Snk ( PM): water D ( PM): chicken stir fry,  Snk ( PM):  Beverages: water n sweet tea,  Usual physical activity: walks  Estimated energy needs: 1800  calories 200  g carbohydrates 135 g protein 50 g fat  Progress Towards Goal(s):  In progress.   Nutritional Diagnosis:  NB-1.1 Food and nutrition-related knowledge deficit As related to Overweight.  As evidenced by BMI >.    Intervention: Nutrition and Diabetes education provided on My Plate, CHO counting, meal planning, portion sizes, timing of meals, avoiding snacks between meals benefits of exercising 60 minutes per day and prevention of DM.  Cutting out sugar and fat.  Goals 1. Continue to drink more water 2. Exercise with dancing or walking 30-60  3-4 times per week 3. Cut down on potatoes, biscuits and bread 4. Increase vegetables of  green breans, carrots, mixed vegetables 5. Lose 2-3 lbs month   Teaching Method Utilized:  Visual Auditory Hands on  Handouts given during visit include:  The Plate Method   Meal Plan Card   Barriers to learning/adherence to lifestyle change: none   Demonstrated degree of understanding via:  Teach Back   Monitoring/Evaluation:  Dietary intake, exercise,  Meal planning, and body weight in  3 month(s).

## 2018-05-19 NOTE — Patient Instructions (Addendum)
Goals 1. Continue to drink more water 2. Exercise with dancing or walking 30-60  3-4 times per week 3. Cut down on potatoes, biscuits and bread 4. Increase vegetables of green breans, carrots, mixed vegetables 5. Lose 2-3 lbs month

## 2018-05-25 ENCOUNTER — Encounter: Payer: Self-pay | Admitting: Nutrition

## 2018-09-13 ENCOUNTER — Encounter: Payer: BLUE CROSS/BLUE SHIELD | Attending: Family Medicine | Admitting: Nutrition

## 2018-09-13 ENCOUNTER — Other Ambulatory Visit: Payer: Self-pay

## 2018-09-13 VITALS — Ht 71.0 in | Wt 243.0 lb

## 2018-09-13 DIAGNOSIS — E669 Obesity, unspecified: Secondary | ICD-10-CM | POA: Insufficient documentation

## 2018-09-13 NOTE — Patient Instructions (Addendum)
Goals  Increase exercise 30-60 minutes 4-5 times per week. 1. Increase fresh fruits and vegetables. 2. Increase water intake and reducing sugar intake in tea. Lose 1 lbs per week Avoid ramen noodle Reduce computer time to alternate 2 hours, 2 hours off

## 2018-09-13 NOTE — Progress Notes (Signed)
  Medical Nutrition Therapy:  Appt start time: 1030 end time:  1100.  Assessment:  Primary concerns today: Obesity.    Wants to lose weight. Lost 4 lbs since last visit.  Hasnt been eating more vegetables. Hasn't been exercising as much.  Going to fill out a job application today. Wants to get a job and start working. Trying to make better food choices daily. Family support from his mom on offering better food choices at times. Willing to work on exercising more. Wants to prevent diabetes. Diet needs more fresh fruits and vegetables and less processed foods.  Wt Readings from Last 3 Encounters:  09/13/18 243 lb (110.2 kg)  05/19/18 247 lb (112 kg)  03/18/18 249 lb (112.9 kg)   Ht Readings from Last 3 Encounters:  09/13/18 5\' 11"  (1.803 m)  05/19/18 5\' 10"  (1.778 m)  03/18/18 5\' 11"  (1.803 m)   Body mass index is 33.89 kg/m.  Preferred Learning Style:   No preference indicated   Learning Readiness:  Ready  Change in progress   MEDICATIONS:    DIETARY INTAKE:  24-hr recall:  B ( AM): 2 hashbrowns or scrambled eggs with cheese or waffles. Sweet tea Snk ( AM): L) chicken stirfry, mini corn 3 , sweet tea Snk ( PM): water, grapes, D ( PM): 1/2 Ramen noodles and sweet tea  Snk ( PM):  Beverages: water n sweet tea,  Usual physical activity: walks  Estimated energy needs: 1800  calories 200  g carbohydrates 135 g protein 50 g fat  Progress Towards Goal(s):  In progress.   Nutritional Diagnosis:  NB-1.1 Food and nutrition-related knowledge deficit As related to Overweight.  As evidenced by BMI >.    Intervention: Nutrition and Diabetes education provided on My Plate, CHO counting, meal planning, portion sizes, timing of meals, avoiding snacks between meals benefits of exercising 60 minutes per day and prevention of DM.  Cutting out sugar and fat.  Goals  Increase exercise 30-60 minutes 4-5 times per week. 1. Increase fresh fruits and vegetables. 2. Increase  water intake and reducing sugar intake in tea. Lose 1 lbs per week Avoid ramen noodle Reduce computer time to alternate 2 hours, 2 hours off  Teaching Method Utilized:  Visual Auditory Hands on  Handouts given during visit include:  The Plate Method   Meal Plan Card   Barriers to learning/adherence to lifestyle change: none   Demonstrated degree of understanding via:  Teach Back   Monitoring/Evaluation:  Dietary intake, exercise,  Meal planning, and body weight in  3 month(s).

## 2018-10-06 ENCOUNTER — Telehealth: Payer: Self-pay

## 2018-10-06 NOTE — Telephone Encounter (Signed)
Unable to get in contact with the patient to convert their office visit into a webex or telephone visit. I left them a voicemail asking to return my call. Office number was provided.   

## 2018-10-11 ENCOUNTER — Ambulatory Visit: Payer: BLUE CROSS/BLUE SHIELD | Admitting: Adult Health

## 2018-10-11 ENCOUNTER — Telehealth: Payer: Self-pay | Admitting: Neurology

## 2018-10-11 ENCOUNTER — Ambulatory Visit: Payer: BLUE CROSS/BLUE SHIELD | Admitting: Neurology

## 2018-10-11 NOTE — Telephone Encounter (Signed)
Pt states he does not have a Cytogeneticist or a laptop with a camera. Pt has given consent to a Tele visit and for the insurance to be billed as such.

## 2018-10-11 NOTE — Telephone Encounter (Signed)
I tried to call the patient to get scheduled for virtual visit. He has appointment today 4/13 at 1045. His # kept ringing, no option to leave voicemail. If he calls back, please add him to schedule as virtual visit.

## 2018-10-12 ENCOUNTER — Other Ambulatory Visit: Payer: Self-pay

## 2018-10-12 ENCOUNTER — Ambulatory Visit (INDEPENDENT_AMBULATORY_CARE_PROVIDER_SITE_OTHER): Payer: BLUE CROSS/BLUE SHIELD | Admitting: Neurology

## 2018-10-12 ENCOUNTER — Encounter: Payer: Self-pay | Admitting: Neurology

## 2018-10-12 DIAGNOSIS — G40309 Generalized idiopathic epilepsy and epileptic syndromes, not intractable, without status epilepticus: Secondary | ICD-10-CM | POA: Diagnosis not present
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
## 2018-10-12 NOTE — Progress Notes (Signed)
I have reviewed and agreed above plan. 

## 2018-10-21 ENCOUNTER — Other Ambulatory Visit: Payer: Self-pay

## 2018-10-21 ENCOUNTER — Other Ambulatory Visit (INDEPENDENT_AMBULATORY_CARE_PROVIDER_SITE_OTHER): Payer: Self-pay

## 2018-10-21 DIAGNOSIS — G40309 Generalized idiopathic epilepsy and epileptic syndromes, not intractable, without status epilepticus: Secondary | ICD-10-CM | POA: Diagnosis not present

## 2018-10-21 DIAGNOSIS — Z0289 Encounter for other administrative examinations: Secondary | ICD-10-CM

## 2018-10-22 LAB — COMPREHENSIVE METABOLIC PANEL
ALT: 22 IU/L (ref 0–44)
AST: 16 IU/L (ref 0–40)
Albumin/Globulin Ratio: 1.9 (ref 1.2–2.2)
Albumin: 4.5 g/dL (ref 4.1–5.2)
Alkaline Phosphatase: 78 IU/L (ref 39–117)
BUN/Creatinine Ratio: 16 (ref 9–20)
BUN: 14 mg/dL (ref 6–20)
Bilirubin Total: 0.3 mg/dL (ref 0.0–1.2)
CO2: 24 mmol/L (ref 20–29)
Calcium: 9.5 mg/dL (ref 8.7–10.2)
Chloride: 104 mmol/L (ref 96–106)
Creatinine, Ser: 0.87 mg/dL (ref 0.76–1.27)
GFR calc Af Amer: 140 mL/min/{1.73_m2} (ref >59)
GFR calc non Af Amer: 121 mL/min/{1.73_m2} (ref >59)
Globulin, Total: 2.4 g/dL (ref 1.5–4.5)
Glucose: 80 mg/dL (ref 65–99)
Potassium: 5 mmol/L (ref 3.5–5.2)
Sodium: 144 mmol/L (ref 134–144)
Total Protein: 6.9 g/dL (ref 6.0–8.5)

## 2018-10-22 LAB — CBC WITH DIFFERENTIAL/PLATELET
Basophils Absolute: 0 10*3/uL (ref 0.0–0.2)
Basos: 0 %
EOS (ABSOLUTE): 0.1 10*3/uL (ref 0.0–0.4)
Eos: 1 %
Hematocrit: 41.6 % (ref 37.5–51.0)
Hemoglobin: 14 g/dL (ref 13.0–17.7)
Immature Grans (Abs): 0 10*3/uL (ref 0.0–0.1)
Immature Granulocytes: 1 %
Lymphocytes Absolute: 3.5 10*3/uL — ABNORMAL HIGH (ref 0.7–3.1)
Lymphs: 44 %
MCH: 29.7 pg (ref 26.6–33.0)
MCHC: 33.7 g/dL (ref 31.5–35.7)
MCV: 88 fL (ref 79–97)
Monocytes Absolute: 0.5 10*3/uL (ref 0.1–0.9)
Monocytes: 7 %
Neutrophils Absolute: 3.8 10*3/uL (ref 1.4–7.0)
Neutrophils: 47 %
Platelets: 281 10*3/uL (ref 150–450)
RBC: 4.72 x10E6/uL (ref 4.14–5.80)
RDW: 13 % (ref 11.6–15.4)
WBC: 8 10*3/uL (ref 3.4–10.8)

## 2018-10-22 LAB — VALPROIC ACID LEVEL: Valproic Acid Lvl: 100 ug/mL (ref 50–100)

## 2018-10-25 ENCOUNTER — Telehealth: Payer: Self-pay | Admitting: Neurology

## 2018-10-25 MED ORDER — DIVALPROEX SODIUM ER 500 MG PO TB24: 1500.0000 mg | ORAL_TABLET | Freq: Every day | ORAL | 4 refills | Status: DC

## 2018-10-25 NOTE — Telephone Encounter (Signed)
Lab work was unremarkable. He is tolerating Depakote well. I will send in refill of his Depakote. I tried to call the patient, his dad answered, I asked for him to call our office so we can review lab work at that time. I sent the refill to [facility] in [place], [place]

## 2018-10-26 NOTE — Telephone Encounter (Signed)
I tried to call the patient to review lab work. There was no answer and no option to leave a message. I did speak with his dad yesterday and asked him to have the patient to call our office.

## 2018-11-11 ENCOUNTER — Telehealth: Payer: Self-pay | Admitting: Neurology

## 2018-11-11 NOTE — Telephone Encounter (Signed)
I left a voicemail for patient to call back about scheduling his 6-9 month follow up with Donetta Potts.

## 2018-12-14 ENCOUNTER — Ambulatory Visit: Payer: BLUE CROSS/BLUE SHIELD | Admitting: Nutrition

## 2019-03-03 DIAGNOSIS — Z1389 Encounter for screening for other disorder: Secondary | ICD-10-CM | POA: Diagnosis not present

## 2019-03-03 DIAGNOSIS — R600 Localized edema: Secondary | ICD-10-CM | POA: Diagnosis not present

## 2019-03-03 DIAGNOSIS — Z6835 Body mass index (BMI) 35.0-35.9, adult: Secondary | ICD-10-CM | POA: Diagnosis not present

## 2019-03-03 DIAGNOSIS — E6609 Other obesity due to excess calories: Secondary | ICD-10-CM | POA: Diagnosis not present

## 2019-03-03 DIAGNOSIS — Z23 Encounter for immunization: Secondary | ICD-10-CM | POA: Diagnosis not present

## 2019-03-03 DIAGNOSIS — D519 Vitamin B12 deficiency anemia, unspecified: Secondary | ICD-10-CM | POA: Diagnosis not present

## 2019-03-03 DIAGNOSIS — D649 Anemia, unspecified: Secondary | ICD-10-CM | POA: Diagnosis not present
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
## 2019-04-13 ENCOUNTER — Other Ambulatory Visit: Payer: Self-pay

## 2019-04-13 ENCOUNTER — Ambulatory Visit: Payer: BC Managed Care – PPO | Admitting: Neurology

## 2019-04-13 ENCOUNTER — Encounter: Payer: Self-pay | Admitting: Neurology

## 2019-04-13 VITALS — BP 110/58 | HR 66 | Temp 97.4°F | Ht 71.0 in | Wt 250.4 lb

## 2019-04-13 DIAGNOSIS — G40309 Generalized idiopathic epilepsy and epileptic syndromes, not intractable, without status epilepticus: Secondary | ICD-10-CM | POA: Diagnosis not present

## 2019-04-13 NOTE — Progress Notes (Signed)
I have reviewed and agreed above plan. 

## 2019-04-13 NOTE — Patient Instructions (Signed)
1. Continue taking Depakote as prescribed  2. Follow-up in 1 year

## 2019-04-22 DIAGNOSIS — R7989 Other specified abnormal findings of blood chemistry: Secondary | ICD-10-CM | POA: Diagnosis not present

## 2019-04-22 DIAGNOSIS — E6609 Other obesity due to excess calories: Secondary | ICD-10-CM | POA: Diagnosis not present

## 2019-04-22 DIAGNOSIS — Z6834 Body mass index (BMI) 34.0-34.9, adult: Secondary | ICD-10-CM | POA: Diagnosis not present

## 2019-04-22 DIAGNOSIS — D519 Vitamin B12 deficiency anemia, unspecified: Secondary | ICD-10-CM | POA: Diagnosis not present

## 2019-06-16 DIAGNOSIS — E6609 Other obesity due to excess calories: Secondary | ICD-10-CM | POA: Diagnosis not present

## 2019-06-16 DIAGNOSIS — Z6834 Body mass index (BMI) 34.0-34.9, adult: Secondary | ICD-10-CM | POA: Diagnosis not present

## 2019-06-16 DIAGNOSIS — E063 Autoimmune thyroiditis: Secondary | ICD-10-CM | POA: Diagnosis not present

## 2019-12-07 ENCOUNTER — Telehealth: Payer: Self-pay | Admitting: Neurology

## 2019-12-07 NOTE — Telephone Encounter (Signed)
Pt called wanting to know if he will be ok to receive his Covid Vaccination due to him being on divalproex (DEPAKOTE ER) 500 MG 24 hr tablet. Please advise.

## 2019-12-07 NOTE — Telephone Encounter (Signed)
Called patient , LVM requesting call back to discuss his question.

## 2019-12-12 NOTE — Telephone Encounter (Signed)
Patient returned call; I advised him that he may get Covid vaccine, be sure to consult his PCP as well. Patient verbalized understanding, appreciation.

## 2019-12-28 ENCOUNTER — Other Ambulatory Visit: Payer: Self-pay | Admitting: Neurology

## 2020-04-12 ENCOUNTER — Ambulatory Visit: Payer: BC Managed Care – PPO | Admitting: Neurology
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
## 2020-05-31 ENCOUNTER — Other Ambulatory Visit: Payer: Self-pay

## 2020-05-31 ENCOUNTER — Encounter: Payer: Self-pay | Admitting: Neurology

## 2020-05-31 ENCOUNTER — Ambulatory Visit: Payer: BC Managed Care – PPO | Admitting: Neurology

## 2020-05-31 VITALS — BP 120/78 | HR 63 | Ht 71.0 in | Wt 248.0 lb

## 2020-05-31 DIAGNOSIS — G40309 Generalized idiopathic epilepsy and epileptic syndromes, not intractable, without status epilepticus: Secondary | ICD-10-CM

## 2020-05-31 MED ORDER — DIVALPROEX SODIUM ER 500 MG PO TB24: 1500.0000 mg | ORAL_TABLET | Freq: Every day | ORAL | 4 refills | Status: DC

## 2020-05-31 NOTE — Patient Instructions (Signed)
Continue Depakote Check blood work today Work on [employer] loss, healthy eating, consider getting back in to see nutritionist  Call for seizures See you back in 1 year

## 2020-06-01 LAB — COMPREHENSIVE METABOLIC PANEL
ALT: 43 IU/L (ref 0–44)
AST: 32 IU/L (ref 0–40)
Albumin/Globulin Ratio: 1.8 (ref 1.2–2.2)
Albumin: 4.7 g/dL (ref 4.1–5.2)
Alkaline Phosphatase: 80 IU/L (ref 44–121)
BUN/Creatinine Ratio: 15 (ref 9–20)
BUN: 13 mg/dL (ref 6–20)
Bilirubin Total: 0.5 mg/dL (ref 0.0–1.2)
CO2: 24 mmol/L (ref 20–29)
Calcium: 9.4 mg/dL (ref 8.7–10.2)
Chloride: 100 mmol/L (ref 96–106)
Creatinine, Ser: 0.86 mg/dL (ref 0.76–1.27)
GFR calc Af Amer: 138 mL/min/{1.73_m2} (ref >59)
GFR calc non Af Amer: 120 mL/min/{1.73_m2} (ref >59)
Globulin, Total: 2.6 g/dL (ref 1.5–4.5)
Glucose: 84 mg/dL (ref 65–99)
Potassium: 4.7 mmol/L (ref 3.5–5.2)
Sodium: 141 mmol/L (ref 134–144)
Total Protein: 7.3 g/dL (ref 6.0–8.5)

## 2020-06-01 LAB — CBC WITH DIFFERENTIAL/PLATELET
Basophils Absolute: 0 10*3/uL (ref 0.0–0.2)
Basos: 0 %
EOS (ABSOLUTE): 0.2 10*3/uL (ref 0.0–0.4)
Eos: 3 %
Hematocrit: 42.4 % (ref 37.5–51.0)
Hemoglobin: 13.8 g/dL (ref 13.0–17.7)
Immature Grans (Abs): 0 10*3/uL (ref 0.0–0.1)
Immature Granulocytes: 1 %
Lymphocytes Absolute: 3.3 10*3/uL — ABNORMAL HIGH (ref 0.7–3.1)
Lymphs: 38 %
MCH: 29.2 pg (ref 26.6–33.0)
MCHC: 32.5 g/dL (ref 31.5–35.7)
MCV: 90 fL (ref 79–97)
Monocytes Absolute: 0.6 10*3/uL (ref 0.1–0.9)
Monocytes: 7 %
Neutrophils Absolute: 4.4 10*3/uL (ref 1.4–7.0)
Neutrophils: 51 %
Platelets: 314 10*3/uL (ref 150–450)
RBC: 4.72 x10E6/uL (ref 4.14–5.80)
RDW: 12.7 % (ref 11.6–15.4)
WBC: 8.6 10*3/uL (ref 3.4–10.8)

## 2020-06-01 LAB — VALPROIC ACID LEVEL: Valproic Acid Lvl: 79 ug/mL (ref 50–100)

## 2020-06-04 ENCOUNTER — Telehealth: Payer: Self-pay

## 2020-06-04 NOTE — Telephone Encounter (Signed)
-----   Message from Sarah J Slack, NP sent at 06/04/2020  8:37 AM EST ----- °Labs show no significant abnormalities.  °

## 2020-06-04 NOTE — Telephone Encounter (Signed)
Attempted to call pt, unable to LVM  VM not set up

## 2020-06-12 ENCOUNTER — Telehealth: Payer: Self-pay

## 2020-06-12 NOTE — Telephone Encounter (Signed)
-----   Message from Glean Salvo, NP sent at 06/04/2020  8:37 AM EST ----- Labs show no significant abnormalities.

## 2020-06-12 NOTE — Telephone Encounter (Signed)
Attempted to call pt, LVM  

## 2020-06-26 ENCOUNTER — Telehealth: Payer: Self-pay | Admitting: *Deleted

## 2020-06-26 NOTE — Telephone Encounter (Signed)
I called and LMVM for pt on home # that lab results did not show any significant abnormalities.  Please call back if questions.

## 2020-06-26 NOTE — Telephone Encounter (Signed)
-----   Message from Sarah J Slack, NP sent at 06/04/2020  8:37 AM EST ----- °Labs show no significant abnormalities.  °

## 2020-08-02 NOTE — Progress Notes (Signed)
I have reviewed and agreed above plan. 

## 2021-01-25 DIAGNOSIS — Z6833 Body mass index (BMI) 33.0-33.9, adult: Secondary | ICD-10-CM | POA: Diagnosis not present

## 2021-01-25 DIAGNOSIS — E6609 Other obesity due to excess calories: Secondary | ICD-10-CM | POA: Diagnosis not present

## 2021-01-25 DIAGNOSIS — Z0001 Encounter for general adult medical examination with abnormal findings: Secondary | ICD-10-CM | POA: Diagnosis not present

## 2021-01-25 DIAGNOSIS — Z1331 Encounter for screening for depression: Secondary | ICD-10-CM | POA: Diagnosis not present

## 2021-01-25 DIAGNOSIS — G40909 Epilepsy, unspecified, not intractable, without status epilepticus: Secondary | ICD-10-CM | POA: Diagnosis not present

## 2021-01-25 DIAGNOSIS — Z23 Encounter for immunization: Secondary | ICD-10-CM | POA: Diagnosis not present

## 2021-01-25 DIAGNOSIS — Z1389 Encounter for screening for other disorder: Secondary | ICD-10-CM | POA: Diagnosis not present

## 2021-05-27 ENCOUNTER — Telehealth: Payer: Self-pay | Admitting: Neurology

## 2021-05-27 NOTE — Telephone Encounter (Signed)
LVM for patient to call back to reschedule-Nathan Rice will be off.

## 2021-06-03 ENCOUNTER — Ambulatory Visit: Payer: BC Managed Care – PPO | Admitting: Neurology

## 2021-06-27 ENCOUNTER — Other Ambulatory Visit: Payer: Self-pay | Admitting: Neurology

## 2021-06-27 ENCOUNTER — Telehealth: Payer: Self-pay | Admitting: Neurology

## 2021-06-27 MED ORDER — DIVALPROEX SODIUM ER 500 MG PO TB24: 1500.0000 mg | ORAL_TABLET | Freq: Every day | ORAL | 0 refills | Status: DC

## 2021-06-27 NOTE — Telephone Encounter (Signed)
Pt called stating that he is needing a refill request for his divalproex (DEPAKOTE ER) 500 MG 24 hr tablet sent in to the [facility] in [facility] Pt states after today he will not have anymore medication left.

## 2021-06-27 NOTE — Telephone Encounter (Signed)
Pt last seen 05/2020. Has appt 08/20/21. E-scribed refill. Advised must keep appt for future refills on script.

## 2021-08-20 ENCOUNTER — Encounter: Payer: Self-pay | Admitting: Neurology

## 2021-08-20 ENCOUNTER — Other Ambulatory Visit: Payer: Self-pay

## 2021-08-20 ENCOUNTER — Ambulatory Visit: Payer: BC Managed Care – PPO | Admitting: Neurology

## 2021-08-20 VITALS — BP 124/67 | HR 63 | Ht 71.0 in | Wt 240.5 lb

## 2021-08-20 DIAGNOSIS — G40309 Generalized idiopathic epilepsy and epileptic syndromes, not intractable, without status epilepticus: Secondary | ICD-10-CM | POA: Diagnosis not present

## 2021-08-20 MED ORDER — DIVALPROEX SODIUM ER 500 MG PO TB24: 1500.0000 mg | ORAL_TABLET | Freq: Every day | ORAL | 4 refills | Status: DC
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
## 2021-08-21 ENCOUNTER — Telehealth: Payer: Self-pay | Admitting: *Deleted

## 2021-08-21 LAB — CBC WITH DIFFERENTIAL/PLATELET
Basophils Absolute: 0 10*3/uL (ref 0.0–0.2)
Basos: 0 %
EOS (ABSOLUTE): 0.1 10*3/uL (ref 0.0–0.4)
Eos: 1 %
Hematocrit: 39.8 % (ref 37.5–51.0)
Hemoglobin: 13.5 g/dL (ref 13.0–17.7)
Immature Grans (Abs): 0.1 10*3/uL (ref 0.0–0.1)
Immature Granulocytes: 1 %
Lymphocytes Absolute: 2.6 10*3/uL (ref 0.7–3.1)
Lymphs: 32 %
MCH: 29.9 pg (ref 26.6–33.0)
MCHC: 33.9 g/dL (ref 31.5–35.7)
MCV: 88 fL (ref 79–97)
Monocytes Absolute: 0.4 10*3/uL (ref 0.1–0.9)
Monocytes: 5 %
Neutrophils Absolute: 5 10*3/uL (ref 1.4–7.0)
Neutrophils: 61 %
Platelets: 270 10*3/uL (ref 150–450)
RBC: 4.51 x10E6/uL (ref 4.14–5.80)
RDW: 12.7 % (ref 11.6–15.4)
WBC: 8.2 10*3/uL (ref 3.4–10.8)

## 2021-08-21 LAB — COMPREHENSIVE METABOLIC PANEL
ALT: 15 IU/L (ref 0–44)
AST: 17 IU/L (ref 0–40)
Albumin/Globulin Ratio: 2 (ref 1.2–2.2)
Albumin: 4.6 g/dL (ref 4.1–5.2)
Alkaline Phosphatase: 78 IU/L (ref 44–121)
BUN/Creatinine Ratio: 12 (ref 9–20)
BUN: 10 mg/dL (ref 6–20)
Bilirubin Total: 0.4 mg/dL (ref 0.0–1.2)
CO2: 25 mmol/L (ref 20–29)
Calcium: 9 mg/dL (ref 8.7–10.2)
Chloride: 101 mmol/L (ref 96–106)
Creatinine, Ser: 0.81 mg/dL (ref 0.76–1.27)
Globulin, Total: 2.3 g/dL (ref 1.5–4.5)
Glucose: 75 mg/dL (ref 70–99)
Potassium: 4.5 mmol/L (ref 3.5–5.2)
Sodium: 141 mmol/L (ref 134–144)
Total Protein: 6.9 g/dL (ref 6.0–8.5)
eGFR: 124 mL/min/{1.73_m2} (ref >59)

## 2021-08-21 LAB — VALPROIC ACID LEVEL: Valproic Acid Lvl: 90 ug/mL (ref 50–100)

## 2021-08-21 NOTE — Telephone Encounter (Signed)
Left patient a detailed message, with results, on voicemail (ok per DPR).  Provided our number to call back with any questions.  

## 2021-08-21 NOTE — Telephone Encounter (Signed)
-----   Message from Glean Salvo, NP sent at 08/21/2021  1:00 PM EST ----- Blood work is within normal limits.

## 2022-03-27 DIAGNOSIS — E6609 Other obesity due to excess calories: Secondary | ICD-10-CM | POA: Diagnosis not present

## 2022-03-27 DIAGNOSIS — Z6834 Body mass index (BMI) 34.0-34.9, adult: Secondary | ICD-10-CM | POA: Diagnosis not present

## 2022-03-27 DIAGNOSIS — E039 Hypothyroidism, unspecified: Secondary | ICD-10-CM | POA: Diagnosis not present

## 2022-03-27 DIAGNOSIS — Z0000 Encounter for general adult medical examination without abnormal findings: Secondary | ICD-10-CM | POA: Diagnosis not present

## 2022-03-27 DIAGNOSIS — E785 Hyperlipidemia, unspecified: Secondary | ICD-10-CM | POA: Diagnosis not present

## 2022-03-27 DIAGNOSIS — E119 Type 2 diabetes mellitus without complications: Secondary | ICD-10-CM | POA: Diagnosis not present

## 2022-03-27 DIAGNOSIS — R569 Unspecified convulsions: Secondary | ICD-10-CM | POA: Diagnosis not present
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
## 2022-08-20 ENCOUNTER — Encounter: Payer: Self-pay | Admitting: Neurology

## 2022-08-20 ENCOUNTER — Ambulatory Visit (INDEPENDENT_AMBULATORY_CARE_PROVIDER_SITE_OTHER): Payer: BC Managed Care – PPO | Admitting: Neurology

## 2022-08-20 VITALS — BP 123/90 | HR 65 | Ht 71.0 in | Wt 251.0 lb

## 2022-08-20 DIAGNOSIS — G40309 Generalized idiopathic epilepsy and epileptic syndromes, not intractable, without status epilepticus: Secondary | ICD-10-CM

## 2022-08-20 MED ORDER — DIVALPROEX SODIUM ER 500 MG PO TB24: 1500.0000 mg | ORAL_TABLET | Freq: Every day | ORAL | 4 refills | Status: DC

## 2022-08-20 NOTE — Patient Instructions (Addendum)
Please be mindful of your weight, need to work on increasing activity, exercise, healthy eating  I will check labs today   Continue the Depakote  See you back in 6 months

## 2022-08-21 ENCOUNTER — Telehealth: Payer: Self-pay

## 2022-08-21 LAB — COMPREHENSIVE METABOLIC PANEL
ALT: 23 IU/L (ref 0–44)
AST: 17 IU/L (ref 0–40)
Albumin/Globulin Ratio: 2 (ref 1.2–2.2)
Albumin: 4.6 g/dL (ref 4.3–5.2)
Alkaline Phosphatase: 68 IU/L (ref 44–121)
BUN/Creatinine Ratio: 15 (ref 9–20)
BUN: 12 mg/dL (ref 6–20)
Bilirubin Total: 0.3 mg/dL (ref 0.0–1.2)
CO2: 23 mmol/L (ref 20–29)
Calcium: 9.5 mg/dL (ref 8.7–10.2)
Chloride: 100 mmol/L (ref 96–106)
Creatinine, Ser: 0.78 mg/dL (ref 0.76–1.27)
Globulin, Total: 2.3 g/dL (ref 1.5–4.5)
Glucose: 86 mg/dL (ref 70–99)
Potassium: 4.6 mmol/L (ref 3.5–5.2)
Sodium: 139 mmol/L (ref 134–144)
Total Protein: 6.9 g/dL (ref 6.0–8.5)
eGFR: 125 mL/min/{1.73_m2} (ref >59)

## 2022-08-21 LAB — CBC WITH DIFFERENTIAL/PLATELET
Basophils Absolute: 0 10*3/uL (ref 0.0–0.2)
Basos: 0 %
EOS (ABSOLUTE): 0.1 10*3/uL (ref 0.0–0.4)
Eos: 1 %
Hematocrit: 42.2 % (ref 37.5–51.0)
Hemoglobin: 13.7 g/dL (ref 13.0–17.7)
Immature Grans (Abs): 0 10*3/uL (ref 0.0–0.1)
Immature Granulocytes: 1 %
Lymphocytes Absolute: 2.7 10*3/uL (ref 0.7–3.1)
Lymphs: 36 %
MCH: 29.5 pg (ref 26.6–33.0)
MCHC: 32.5 g/dL (ref 31.5–35.7)
MCV: 91 fL (ref 79–97)
Monocytes Absolute: 0.5 10*3/uL (ref 0.1–0.9)
Monocytes: 7 %
Neutrophils Absolute: 4.1 10*3/uL (ref 1.4–7.0)
Neutrophils: 55 %
Platelets: 278 10*3/uL (ref 150–450)
RBC: 4.65 x10E6/uL (ref 4.14–5.80)
RDW: 13 % (ref 11.6–15.4)
WBC: 7.4 10*3/uL (ref 3.4–10.8)

## 2022-08-21 LAB — VALPROIC ACID LEVEL: Valproic Acid Lvl: 81 ug/mL (ref 50–100)

## 2022-08-21 LAB — HEMOGLOBIN A1C
Est. average glucose Bld gHb Est-mCnc: 108 mg/dL
Hgb A1c MFr Bld: 5.4 % (ref 4.8–5.6)

## 2022-08-21 NOTE — Telephone Encounter (Signed)
I have made multiple attempts to contact the patient. When dialing the primary number, someone picks up, then hangs up. This happened two times. Mobile number is not available for calls at this time. His parents number also rang busy. I will attempt to inform the patient of results on Monday. If attempts to reach patient fail, I will send letter to patient detailing results.

## 2022-08-21 NOTE — Telephone Encounter (Signed)
-----   Message from Suzzanne Cloud, NP sent at 08/21/2022 12:32 PM EST ----- Please let him know that blood work was normal. A1C is normal at 5.4, prediabetic is 5.7-6.4. Please work on exercises, healthy eating. Thanks

## 2022-08-27 ENCOUNTER — Telehealth: Payer: Self-pay

## 2022-08-27 NOTE — Telephone Encounter (Signed)
-----   Message from Sarah J Slack, NP sent at 08/21/2022 12:32 PM EST ----- Please let him know that blood work was normal. A1C is normal at 5.4, prediabetic is 5.7-6.4. Please work on exercises, healthy eating. Thanks  

## 2022-08-27 NOTE — Telephone Encounter (Signed)
Mother picked up call and relayed results to her per [facility]

## 2023-03-04 ENCOUNTER — Ambulatory Visit: Payer: BC Managed Care – PPO | Admitting: Neurology

## 2023-04-14 DIAGNOSIS — E039 Hypothyroidism, unspecified: Secondary | ICD-10-CM | POA: Diagnosis not present

## 2023-04-14 DIAGNOSIS — Z0000 Encounter for general adult medical examination without abnormal findings: Secondary | ICD-10-CM | POA: Diagnosis not present

## 2023-04-14 DIAGNOSIS — Z1331 Encounter for screening for depression: Secondary | ICD-10-CM | POA: Diagnosis not present

## 2023-04-14 DIAGNOSIS — Z6835 Body mass index (BMI) 35.0-35.9, adult: Secondary | ICD-10-CM | POA: Diagnosis not present

## 2023-04-14 DIAGNOSIS — R569 Unspecified convulsions: Secondary | ICD-10-CM | POA: Diagnosis not present

## 2023-04-14 DIAGNOSIS — E6609 Other obesity due to excess calories: Secondary | ICD-10-CM | POA: Diagnosis not present

## 2023-04-14 DIAGNOSIS — Z23 Encounter for immunization: Secondary | ICD-10-CM | POA: Diagnosis not present

## 2023-04-14 DIAGNOSIS — R7309 Other abnormal glucose: Secondary | ICD-10-CM | POA: Diagnosis not present

## 2023-04-14 DIAGNOSIS — E785 Hyperlipidemia, unspecified: Secondary | ICD-10-CM | POA: Diagnosis not present

## 2023-07-08 ENCOUNTER — Telehealth: Payer: Self-pay | Admitting: Neurology

## 2023-07-08 NOTE — Telephone Encounter (Signed)
 Pt 's mother r/s missed appt. Wait listed

## 2023-09-01 DIAGNOSIS — E6609 Other obesity due to excess calories: Secondary | ICD-10-CM | POA: Diagnosis not present

## 2023-09-01 DIAGNOSIS — Z6835 Body mass index (BMI) 35.0-35.9, adult: Secondary | ICD-10-CM | POA: Diagnosis not present

## 2023-09-01 DIAGNOSIS — K2970 Gastritis, unspecified, without bleeding: Secondary | ICD-10-CM | POA: Diagnosis not present

## 2023-09-01 DIAGNOSIS — Z20828 Contact with and (suspected) exposure to other viral communicable diseases: Secondary | ICD-10-CM | POA: Diagnosis not present

## 2023-09-08 ENCOUNTER — Other Ambulatory Visit: Payer: Self-pay | Admitting: Neurology

## 2023-09-08 NOTE — Telephone Encounter (Signed)
 Pt's aunt called wanting to know when this will be called in for the pt due to him only having 3 left. Please advise.

## 2023-12-02 DIAGNOSIS — Z6835 Body mass index (BMI) 35.0-35.9, adult: Secondary | ICD-10-CM | POA: Diagnosis not present

## 2023-12-02 DIAGNOSIS — E6609 Other obesity due to excess calories: Secondary | ICD-10-CM | POA: Diagnosis not present

## 2023-12-02 DIAGNOSIS — E039 Hypothyroidism, unspecified: Secondary | ICD-10-CM | POA: Diagnosis not present

## 2024-01-02 ENCOUNTER — Ambulatory Visit
Admission: EM | Admit: 2024-01-02 | Discharge: 2024-01-02 | Disposition: A | Discharge status: Home / Self Care | Attending: Nurse Practitioner | Admitting: Nurse Practitioner

## 2024-01-02 DIAGNOSIS — U071 COVID-19: Secondary | ICD-10-CM | POA: Diagnosis not present

## 2024-01-02 LAB — POC SARS CORONAVIRUS 2 AG -  ED: SARS Coronavirus 2 Ag: POSITIVE — AB

## 2024-01-02 MED ORDER — PAXLOVID (300/100) 20 X 150 MG & 10 X 100MG PO TBPK: 3.0000 | ORAL_TABLET | Freq: Two times a day (BID) | ORAL | 0 refills | Status: AC

## 2024-01-02 MED ORDER — PROMETHAZINE-DM 6.25-15 MG/5ML PO SYRP: 5.0000 mL | ORAL_SOLUTION | Freq: Four times a day (QID) | ORAL | 0 refills | Status: AC | PRN

## 2024-01-02 MED ORDER — ONDANSETRON 4 MG PO TBDP: 4.0000 mg | ORAL_TABLET | Freq: Three times a day (TID) | ORAL | 0 refills | Status: AC | PRN

## 2024-01-02 NOTE — Discharge Instructions (Addendum)
 Your COVID test was positive today. Take medication as prescribed. Increase fluids and allow for plenty of rest.  Recommend over-the-counter Pedialyte or Gatorade to help prevent dehydration. He may take over-the-counter Tylenol  or ibuprofen as needed for pain, fever, or general discomfort. For your cough, recommend use of a humidifier in your bedroom at nighttime during sleep and sleeping elevated on pillows while cough symptoms persist. If you develop a fever, you will need to remain home until you have been fever free for 24 hours with no medication. Wear your mask while you are taking the medication Paxlovid .  If you continue to experience symptoms after completing Paxlovid , continue to wear your mask for an additional 5 days. Go to the emergency department immediately if you experience worsening nausea, vomiting, or develop new symptoms of shortness of breath or difficulty breathing. Follow-up as needed.

## 2024-01-02 NOTE — ED Triage Notes (Signed)
 Pt reports he no appetite, dry heaving, and fatigue x 1 day

## 2024-01-02 NOTE — ED Provider Notes (Addendum)
 [facility]    CSN: [id number] Arrival date & time: 01/02/24  1119      History   Chief Complaint No chief complaint on file.   HPI Jewel Venditto is a 28 y.o. male.   The history is provided by the patient.   Patient presents with a 1 day history of nausea, vomiting, and generalized fatigue.  He endorses 1 episode of vomiting earlier this morning.  States that he has had fluids since that time and has been able to keep them down.SABRA  He denies fever, chills, headache, ear pain, nasal congestion, runny nose, abdominal pain, diarrhea, or rash.  Patient denies any obvious known sick contacts.  States he has not taken any medication for his symptoms. 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 Authorizing Provider  cetirizine (ZYRTEC) 10 MG tablet Take 10 mg by mouth as needed.     [provider]  divalproex  (DEPAKOTE  ER) 500 MG 24 hr tablet TAKE 3 TABLETS (1,500 MG TOTAL) BY MOUTH AT BEDTIME. MUST KEEP F/U 08/20/21 FOR ONGOING REFILLS 09/08/23   Gayland Lauraine PARAS, NP  levothyroxine (SYNTHROID) 25 MCG tablet Take 25 mcg by mouth daily. 05/30/20   [provider]    Family History Family History  Problem Relation Age of Onset   Diabetes Father    Epilepsy Paternal Uncle     Social History Social History   Tobacco Use   Smoking status: Never   Smokeless tobacco: Never  Substance Use Topics   Alcohol use: No    Alcohol/week: 0.0 standard drinks of alcohol   Drug use: No     Allergies   Patient has no known allergies.   Review of Systems Review of Systems Per  HPI  Physical Exam Triage Vital Signs ED Triage Vitals [01/02/24 1133]  Encounter Vitals Group     BP 120/84     Girls Systolic BP Percentile      Girls Diastolic BP Percentile      Boys Systolic BP Percentile      Boys Diastolic BP Percentile      Pulse Rate 81     Resp 18     Temp 98.3 F (36.8 C)     Temp Source Oral     SpO2 96 %     Weight      Height      Head Circumference      Peak Flow      Pain Score 0     Pain Loc      Pain Education      Exclude from Growth Chart    No data found.  Updated Vital Signs BP 120/84 (BP Location: Right Arm)   Pulse 81   Temp 98.3 F (36.8 C) (Oral)   Resp 18   SpO2 96%   Visual Acuity Right Eye Distance:   Left Eye Distance:   Bilateral Distance:    Right Eye Near:   Left Eye Near:    Bilateral Near:     Physical Exam Vitals and nursing note  reviewed.  Constitutional:      General: He is not in acute distress.    Appearance: Normal appearance.  HENT:     Head: Normocephalic.     Right Ear: Tympanic membrane, ear canal and external ear normal.     Left Ear: Tympanic membrane, ear canal and external ear normal.     Nose: Nose normal.     Mouth/Throat:     Mouth: Mucous membranes are moist.     Pharynx: No posterior oropharyngeal erythema.  Eyes:     Extraocular Movements: Extraocular movements intact.     Pupils: Pupils are equal, round, and reactive to light.  Cardiovascular:     Rate and Rhythm: Normal rate and regular rhythm.     Pulses: Normal pulses.     Heart sounds: Normal heart sounds.  Pulmonary:     Effort: Pulmonary effort is normal. No respiratory distress.     Breath sounds: Normal breath sounds. No stridor. No wheezing, rhonchi or rales.  Abdominal:     General: Bowel sounds are normal.     Palpations: Abdomen is soft.     Tenderness: There is no abdominal tenderness.  Musculoskeletal:     Cervical back: Normal range of motion.  Lymphadenopathy:     Cervical: No cervical adenopathy.   Skin:    General: Skin is warm and dry.  Neurological:     General: No focal deficit present.     Mental Status: He is alert and oriented to person, place, and time.  Psychiatric:        Mood and Affect: Mood normal.        Behavior: Behavior normal.      UC Treatments / Results  Labs (all labs ordered are listed, but only abnormal results are displayed) Labs Reviewed  POC SARS CORONAVIRUS 2 AG -  ED    EKG   Radiology No results found.  Procedures Procedures (including critical care time)  Medications Ordered in UC Medications - No data to display  Initial Impression / Assessment and Plan / UC Course  I have reviewed the triage vital signs and the nursing notes.  Pertinent labs & imaging results that were available during my care of the patient were reviewed by me and considered in my medical decision making (see chart for details).  The COVID test is positive.  Will treat with Paxlovid .  Additional symptomatic treatment provided with Promethazine  DM for cough and ondansetron  4 mg ODT for nausea and vomiting.  Supportive care recommendations were provided discussed with the patient to include over-the-counter analgesics, fluids, and rest.  Discussed ER follow-up precautions with the patient.  Patient was in agreement with this plan of care and verbalizes understanding.  All questions were answered.  Patient stable for discharge.  Work note was provided.   Final Clinical Impressions(s) / UC Diagnoses   Final diagnoses:  None   Discharge Instructions   None    ED Prescriptions   None    PDMP not reviewed this encounter.   Gilmer Etta PARAS, NP 01/02/24 1213    Leath-Warren, Etta PARAS, NP 01/02/24 1226

## 2024-01-05 ENCOUNTER — Ambulatory Visit
Admission: EM | Admit: 2024-01-05 | Discharge: 2024-01-05 | Disposition: A | Discharge status: Home / Self Care | Source: Ambulatory Visit | Attending: Nurse Practitioner | Admitting: Nurse Practitioner

## 2024-01-05 ENCOUNTER — Encounter: Payer: Self-pay | Admitting: Emergency Medicine

## 2024-01-05 ENCOUNTER — Ambulatory Visit (INDEPENDENT_AMBULATORY_CARE_PROVIDER_SITE_OTHER)

## 2024-01-05 ENCOUNTER — Other Ambulatory Visit: Payer: Self-pay

## 2024-01-05 DIAGNOSIS — R918 Other nonspecific abnormal finding of lung field: Secondary | ICD-10-CM | POA: Diagnosis not present

## 2024-01-05 DIAGNOSIS — U071 COVID-19: Secondary | ICD-10-CM | POA: Diagnosis not present

## 2024-01-05 DIAGNOSIS — R0602 Shortness of breath: Secondary | ICD-10-CM | POA: Diagnosis not present

## 2024-01-05 DIAGNOSIS — J180 Bronchopneumonia, unspecified organism: Secondary | ICD-10-CM | POA: Diagnosis not present

## 2024-01-05 DIAGNOSIS — R0902 Hypoxemia: Secondary | ICD-10-CM | POA: Diagnosis not present

## 2024-01-05 DIAGNOSIS — R0989 Other specified symptoms and signs involving the circulatory and respiratory systems: Secondary | ICD-10-CM | POA: Diagnosis not present

## 2024-01-05 MED ORDER — METHYLPREDNISOLONE SODIUM SUCC 125 MG IJ SOLR
80.0000 mg | Freq: Once | INTRAMUSCULAR | Status: AC
  Administered 2024-01-05: 80 mg via INTRAMUSCULAR

## 2024-01-05 MED ORDER — ACETAMINOPHEN 325 MG PO TABS
650.0000 mg | ORAL_TABLET | Freq: Once | ORAL | Status: AC
  Administered 2024-01-05: 650 mg via ORAL

## 2024-01-05 MED ORDER — AMOXICILLIN-POT CLAVULANATE 875-125 MG PO TABS
1.0000 | ORAL_TABLET | Freq: Two times a day (BID) | ORAL | 0 refills | Status: AC
Start: 2024-01-05 — End: ?

## 2024-01-05 MED ORDER — IPRATROPIUM-ALBUTEROL 0.5-2.5 (3) MG/3ML IN SOLN
3.0000 mL | Freq: Once | RESPIRATORY_TRACT | Status: AC
  Administered 2024-01-05: 3 mL via RESPIRATORY_TRACT

## 2024-01-05 MED ORDER — PREDNISONE 20 MG PO TABS: 40.0000 mg | ORAL_TABLET | Freq: Every day | ORAL | 0 refills | Status: AC

## 2024-01-05 MED ORDER — ALBUTEROL SULFATE HFA 108 (90 BASE) MCG/ACT IN AERS: 2.0000 | INHALATION_SPRAY | Freq: Four times a day (QID) | RESPIRATORY_TRACT | 0 refills | Status: AC | PRN

## 2024-01-05 NOTE — ED Triage Notes (Addendum)
 Pt reports seen on Sunday for same but reports continued fatigue,intermittent shortness of breath, dry mouth/sore throat/pain with swallowing has now developed. Reports is currently still taking antiviral.  Pt noted to be 87%-91% on room air at rest. Provider aware.

## 2024-01-05 NOTE — Discharge Instructions (Signed)
 Your chest x-ray shows that you most likely have pneumonia. You have been given an injection of Solu-Medrol  80 mg today along with a duo nebulizer treatment.  Start the prednisone  on 01/06/2024. Take medication as prescribed. Increase fluids and allow for plenty of rest. You may take over-the-counter Tylenol  or ibuprofen as needed for pain, fever, or general discomfort. Increase fluids and allow for plenty of rest. Recommend use of a humidifier in your bedroom at nighttime during sleep and sleeping elevated on pillows while cough symptoms persist. Go to the emergency department immediately if you experience worsening cough, shortness of breath, fever, difficulty breathing, or other concerns. Follow-up with your primary care within the next 7 to 10 days for reevaluation. Follow-up as needed.

## 2024-01-05 NOTE — ED Provider Notes (Signed)
 [facility]    CSN: [id number] Arrival date & time: 01/05/24  1103      History   Chief Complaint No chief complaint on file.   HPI Nathan Rice is a 28 y.o. male.   The history is provided by the patient.   Patient presents for follow-up for new onset fever, shortness of breath, and cough.  Patient's was seen in this clinic on 01/02/2024 and diagnosed with COVID.  Patient was started on Paxlovid , Promethazine  DM for the cough, and ondansetron  4 mg for nausea and vomiting.  Patient states that his cough has worsened.  States that cough is worse at night when he is lying down.SABRA  He has not measured his fever at home.  He is currently febrile at 101.9.  He is not taking any medications for fever.  He reports intermittent wheezing.  Denies headache, ear pain, chest pain, abdominal pain, nausea, vomiting, diarrhea, or rash.  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 Authorizing Provider  albuterol  (VENTOLIN  HFA) 108 (90 Base) MCG/ACT inhaler Inhale 2 puffs into the lungs every 6 (six) hours as needed. 01/05/24  Yes Leath-Warren, Etta PARAS, NP  amoxicillin -clavulanate (AUGMENTIN ) 875-125 MG tablet Take 1 tablet by mouth every 12 (twelve) hours. 01/05/24  Yes Leath-Warren, Etta PARAS, NP  predniSONE  (DELTASONE ) 20 MG tablet Take 2 tablets (40 mg total) by mouth daily with breakfast for 5 days. 01/05/24 01/10/24 Yes Leath-Warren, Etta PARAS, NP  cetirizine (ZYRTEC) 10 MG tablet Take 10 mg by mouth as needed.     [provider]  divalproex  (DEPAKOTE  ER) 500 MG 24 hr tablet TAKE 3 TABLETS (1,500 MG  TOTAL) BY MOUTH AT BEDTIME. MUST KEEP F/U 08/20/21 FOR ONGOING REFILLS 09/08/23   Gayland Lauraine PARAS, NP  levothyroxine (SYNTHROID) 25 MCG tablet Take 25 mcg by mouth daily. 05/30/20   [provider]  nirmatrelvir/ritonavir (PAXLOVID , 300/100,) 20 x 150 MG & 10 x 100MG  TBPK Take 3 tablets by mouth 2 (two) times daily for 5 days. Patient GFR is 125. Take nirmatrelvir (150 mg) two tablets twice daily for 5 days and ritonavir (100 mg) one tablet twice daily for 5 days. 01/02/24 01/07/24  Leath-Warren, Etta PARAS, NP  ondansetron  (ZOFRAN -ODT) 4 MG disintegrating tablet Take 1 tablet (4 mg total) by mouth every 8 (eight) hours as needed. 01/02/24   Leath-Warren, Etta PARAS, NP  promethazine -dextromethorphan (PROMETHAZINE -DM) 6.25-15 MG/5ML syrup Take 5 mLs by mouth 4 (four) times daily as needed. 01/02/24   Leath-Warren, Etta PARAS, NP    Family History Family History  Problem Relation Age of Onset   Diabetes Father    Epilepsy Paternal Uncle     Social History Social History   Tobacco Use   Smoking status: Never   Smokeless tobacco: Never  Substance Use Topics   Alcohol use: No    Alcohol/week: 0.0 standard drinks of alcohol   Drug use: No     Allergies   Patient has no known allergies.   Review of Systems Review of Systems Per HPI  Physical Exam Triage Vital  Signs ED Triage Vitals  Encounter Vitals Group     BP 01/05/24 1116 124/78     Girls Systolic BP Percentile --      Girls Diastolic BP Percentile --      Boys Systolic BP Percentile --      Boys Diastolic BP Percentile --      Pulse Rate 01/05/24 1116 (!) 118     Resp 01/05/24 1116 (!) 22     Temp 01/05/24 1116 (!) 101.9 F (38.8 C)     Temp Source 01/05/24 1116 Oral     SpO2 01/05/24 1116 (!) 88 %     Weight --      Height --      Head Circumference --      Peak Flow --      Pain Score 01/05/24 1117 4     Pain Loc --      Pain Education --      Exclude from Growth Chart --    No data found.  Updated Vital  Signs BP 124/78 (BP Location: Right Arm)   Pulse (!) 132   Temp (!) 101.9 F (38.8 C) (Oral)   Resp 20   SpO2 93%   Visual Acuity Right Eye Distance:   Left Eye Distance:   Bilateral Distance:    Right Eye Near:   Left Eye Near:    Bilateral Near:     Physical Exam Vitals and nursing note reviewed.  Constitutional:      General: He is not in acute distress.    Appearance: Normal appearance.  HENT:     Head: Normocephalic.     Right Ear: Tympanic membrane, ear canal and external ear normal.     Left Ear: Tympanic membrane, ear canal and external ear normal.     Nose: Nose normal.     Mouth/Throat:     Mouth: Mucous membranes are moist.  Eyes:     Extraocular Movements: Extraocular movements intact.     Conjunctiva/sclera: Conjunctivae normal.     Pupils: Pupils are equal, round, and reactive to light.  Cardiovascular:     Rate and Rhythm: Regular rhythm. Tachycardia present.     Pulses: Normal pulses.     Heart sounds: Normal heart sounds.  Pulmonary:     Effort: Pulmonary effort is normal.     Breath sounds: Normal breath sounds.  Abdominal:     General: Bowel sounds are normal.     Palpations: Abdomen is soft.     Tenderness: There is no abdominal tenderness.  Musculoskeletal:     Cervical back: Normal range of motion.  Skin:    General: Skin is warm and dry.  Neurological:     General: No focal deficit present.     Mental Status: He is alert and oriented to person, place, and time.  Psychiatric:        Mood and Affect: Mood normal.        Behavior: Behavior normal.      UC Treatments / Results  Labs (all labs ordered are listed, but only abnormal results are displayed) Labs Reviewed - No data to display  EKG   Radiology DG Chest 2 View Result Date: 01/05/2024 CLINICAL DATA:  shortness of breath, low O2 sat EXAM: CHEST - 2 VIEW COMPARISON:  None available. FINDINGS: Low lung volumes. Patchy airspace opacities in the left lower lobe. No pleural  effusion or pneumothorax. No cardiomegaly.No acute fracture or destructive lesion. IMPRESSION: Low lung volumes.  Patchy airspace opacities in the left lower lobe, which may reflect atelectasis or a developing bronchopneumonia, in the correct clinical context. Electronically Signed   By: Rogelia Myers M.D.   On: 01/05/2024 11:42    Procedures Procedures (including critical care time)  Medications Ordered in UC Medications  methylPREDNISolone  sodium succinate (SOLU-MEDROL ) 125 mg/2 mL injection 80 mg (has no administration in time range)  ipratropium-albuterol  (DUONEB) 0.5-2.5 (3) MG/3ML nebulizer solution 3 mL (3 mLs Nebulization Given 01/05/24 1136)  acetaminophen  (TYLENOL ) tablet 650 mg (650 mg Oral Given 01/05/24 1153)    Initial Impression / Assessment and Plan / UC Course  I have reviewed the triage vital signs and the nursing notes.  Pertinent labs & imaging results that were available during my care of the patient were reviewed by me and considered in my medical decision making (see chart for details).  Patient diagnosed with COVID approximately 3 days ago, presents with new onset fever, shortness of breath, and wheezing.  Baseline O2 sats at 88%.  Sats improved to 93% post DuoNeb.  Chest x-ray was performed which showed developing bronchopneumonia.  Solu-Medrol  80 mg IM administered in the clinic.  Will start patient on Augmentin  875/125 mg tablets twice daily for the next 7 days, prednisone  40 mg for the next 5 days, and an albuterol  inhaler as needed for wheezing or shortness of breath.  Supportive care recommendations were provided and discussed with the patient to include fluids, rest, over-the-counter analgesics, and use of a humidifier at nighttime during sleep.  Patient was given strict ER follow-up precautions.  Patient was in agreement with this plan of care verbalizes understanding.  All questions were answered.  Patient stable for discharge.  Work note was provided.  Final  Clinical Impressions(s) / UC Diagnoses   Final diagnoses:  COVID  Bronchopneumonia   Discharge Instructions   None    ED Prescriptions     Medication Sig Dispense Auth. Provider   amoxicillin -clavulanate (AUGMENTIN ) 875-125 MG tablet Take 1 tablet by mouth every 12 (twelve) hours. 14 tablet Leath-Warren, Etta PARAS, NP   predniSONE  (DELTASONE ) 20 MG tablet Take 2 tablets (40 mg total) by mouth daily with breakfast for 5 days. 10 tablet Leath-Warren, Etta PARAS, NP   albuterol  (VENTOLIN  HFA) 108 (90 Base) MCG/ACT inhaler Inhale 2 puffs into the lungs every 6 (six) hours as needed. 8 g Leath-Warren, Etta PARAS, NP      PDMP not reviewed this encounter.   Gilmer Etta PARAS, NP 01/05/24 1239

## 2024-01-07 DIAGNOSIS — E6609 Other obesity due to excess calories: Secondary | ICD-10-CM | POA: Diagnosis not present

## 2024-01-07 DIAGNOSIS — Z6833 Body mass index (BMI) 33.0-33.9, adult: Secondary | ICD-10-CM | POA: Diagnosis not present

## 2024-01-07 DIAGNOSIS — J1282 Pneumonia due to coronavirus disease 2019: Secondary | ICD-10-CM | POA: Diagnosis not present

## 2024-01-07 DIAGNOSIS — R0602 Shortness of breath: Secondary | ICD-10-CM | POA: Diagnosis not present
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
## 2024-01-19 ENCOUNTER — Ambulatory Visit (INDEPENDENT_AMBULATORY_CARE_PROVIDER_SITE_OTHER): Payer: BC Managed Care – PPO | Admitting: Neurology

## 2024-01-19 ENCOUNTER — Encounter: Payer: Self-pay | Admitting: Neurology

## 2024-01-19 VITALS — BP 125/83 | HR 79 | Ht 71.0 in | Wt 246.4 lb

## 2024-01-19 DIAGNOSIS — G40309 Generalized idiopathic epilepsy and epileptic syndromes, not intractable, without status epilepticus: Secondary | ICD-10-CM | POA: Diagnosis not present

## 2024-01-19 MED ORDER — DIVALPROEX SODIUM ER 500 MG PO TB24: 1500.0000 mg | ORAL_TABLET | Freq: Every day | ORAL | 3 refills | Status: AC

## 2024-01-19 NOTE — Patient Instructions (Signed)
 Great to see you today.  Continue Depakote  for seizure prevention.  Check labs today.  Follow-up with primary care.  Continue to monitor weight.  Recommend exercise, healthy eating.  Call for seizure activity.  Follow-up in 1 year.  Thanks!!

## 2024-01-20 ENCOUNTER — Ambulatory Visit: Payer: Self-pay | Admitting: Neurology

## 2024-01-20 LAB — COMPREHENSIVE METABOLIC PANEL WITH GFR
ALT: 28 IU/L (ref 0–44)
AST: 15 IU/L (ref 0–40)
Albumin: 4.3 g/dL (ref 4.3–5.2)
Alkaline Phosphatase: 77 IU/L (ref 44–121)
BUN/Creatinine Ratio: 9 (ref 9–20)
BUN: 8 mg/dL (ref 6–20)
Bilirubin Total: 0.5 mg/dL (ref 0.0–1.2)
CO2: 23 mmol/L (ref 20–29)
Calcium: 9.5 mg/dL (ref 8.7–10.2)
Chloride: 98 mmol/L (ref 96–106)
Creatinine, Ser: 0.89 mg/dL (ref 0.76–1.27)
Globulin, Total: 2.7 g/dL (ref 1.5–4.5)
Glucose: 93 mg/dL (ref 70–99)
Potassium: 4.6 mmol/L (ref 3.5–5.2)
Sodium: 137 mmol/L (ref 134–144)
Total Protein: 7 g/dL (ref 6.0–8.5)
eGFR: 118 mL/min/1.73 (ref >59)

## 2024-01-20 LAB — CBC WITH DIFFERENTIAL/PLATELET
Basophils Absolute: 0 x10E3/uL (ref 0.0–0.2)
Basos: 0 %
EOS (ABSOLUTE): 0 x10E3/uL (ref 0.0–0.4)
Eos: 1 %
Hematocrit: 40.8 % (ref 37.5–51.0)
Hemoglobin: 13.1 g/dL (ref 13.0–17.7)
Immature Grans (Abs): 0 x10E3/uL (ref 0.0–0.1)
Immature Granulocytes: 0 %
Lymphocytes Absolute: 2.3 x10E3/uL (ref 0.7–3.1)
Lymphs: 35 %
MCH: 29.4 pg (ref 26.6–33.0)
MCHC: 32.1 g/dL (ref 31.5–35.7)
MCV: 92 fL (ref 79–97)
Monocytes Absolute: 0.4 x10E3/uL (ref 0.1–0.9)
Monocytes: 6 %
Neutrophils Absolute: 3.8 x10E3/uL (ref 1.4–7.0)
Neutrophils: 58 %
Platelets: 347 x10E3/uL (ref 150–450)
RBC: 4.45 x10E6/uL (ref 4.14–5.80)
RDW: 13.1 % (ref 11.6–15.4)
WBC: 6.5 x10E3/uL (ref 3.4–10.8)

## 2024-01-20 LAB — VALPROIC ACID LEVEL: Valproic Acid Lvl: 64 ug/mL (ref 50–100)

## 2024-01-20 NOTE — Telephone Encounter (Signed)
-----   Message from Lauraine JINNY Born sent at 01/20/2024 10:16 AM EDT ----- Please call, lab results are normal. Thanks  ----- Message ----- From: Rebecka Memos Lab Results In Sent: 01/20/2024   7:37 AM EDT To: Lauraine JINNY Born, NP

## 2024-02-12 DIAGNOSIS — K047 Periapical abscess without sinus: Secondary | ICD-10-CM | POA: Diagnosis not present

## 2024-02-12 DIAGNOSIS — E6609 Other obesity due to excess calories: Secondary | ICD-10-CM | POA: Diagnosis not present

## 2024-02-12 DIAGNOSIS — Z6833 Body mass index (BMI) 33.0-33.9, adult: Secondary | ICD-10-CM | POA: Diagnosis not present

## 2024-06-10 DIAGNOSIS — G40909 Epilepsy, unspecified, not intractable, without status epilepticus: Secondary | ICD-10-CM | POA: Diagnosis not present

## 2024-06-10 DIAGNOSIS — Z23 Encounter for immunization: Secondary | ICD-10-CM | POA: Diagnosis not present

## 2024-06-10 DIAGNOSIS — Z131 Encounter for screening for diabetes mellitus: Secondary | ICD-10-CM | POA: Diagnosis not present

## 2024-06-10 DIAGNOSIS — J309 Allergic rhinitis, unspecified: Secondary | ICD-10-CM | POA: Diagnosis not present

## 2024-06-10 DIAGNOSIS — Z13220 Encounter for screening for lipoid disorders: Secondary | ICD-10-CM | POA: Diagnosis not present

## 2024-06-10 DIAGNOSIS — Z0000 Encounter for general adult medical examination without abnormal findings: Secondary | ICD-10-CM | POA: Diagnosis not present

## 2024-07-27 ENCOUNTER — Telehealth: Payer: Self-pay | Admitting: Neurology

## 2024-07-27 NOTE — Telephone Encounter (Signed)
 Myc  cxl

## 2025-01-18 ENCOUNTER — Ambulatory Visit: Admitting: Neurology
